# Patient Record
Sex: Female | Born: 2000 | Race: Black or African American | Hispanic: No | Marital: Single | Smoking: Never smoker
Health system: Southern US, Community
[De-identification: ages and names within clinical notes are randomized; demographics above are authoritative.]

## PROBLEM LIST (undated history)

## (undated) DIAGNOSIS — R569 Unspecified convulsions: Secondary | ICD-10-CM

## (undated) DIAGNOSIS — F71 Moderate intellectual disabilities: Secondary | ICD-10-CM

## (undated) DIAGNOSIS — R6250 Unspecified lack of expected normal physiological development in childhood: Secondary | ICD-10-CM

---
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
## 2021-02-02 ENCOUNTER — Other Ambulatory Visit (INDEPENDENT_AMBULATORY_CARE_PROVIDER_SITE_OTHER): Payer: Self-pay | Admitting: Pediatrics

## 2021-02-02 DIAGNOSIS — G40319 Generalized idiopathic epilepsy and epileptic syndromes, intractable, without status epilepticus: Secondary | ICD-10-CM

## 2021-02-02 MED ORDER — VIMPAT 100 MG PO TABS: ORAL_TABLET | ORAL | 1 refills | Status: DC

## 2021-02-02 NOTE — Telephone Encounter (Signed)
  Who's calling (name and relationship to patient) : Mom/ Adele   Best contact number:[phone]  Provider they see:Dr. Sharene Skeans   Reason for call:Medication Refill      PRESCRIPTION REFILL ONLY  Name of prescription:Vimpat   Pharmacy:[facility] [address] Ginette Otto, [place]

## 2021-02-19 ENCOUNTER — Encounter (INDEPENDENT_AMBULATORY_CARE_PROVIDER_SITE_OTHER): Payer: Self-pay | Admitting: Pediatrics

## 2021-02-19 ENCOUNTER — Ambulatory Visit (INDEPENDENT_AMBULATORY_CARE_PROVIDER_SITE_OTHER): Payer: Medicaid Other | Admitting: Pediatrics

## 2021-02-19 ENCOUNTER — Other Ambulatory Visit: Payer: Self-pay

## 2021-02-19 VITALS — BP 120/80 | HR 68 | Ht 60.5 in | Wt 99.8 lb

## 2021-02-19 DIAGNOSIS — G40319 Generalized idiopathic epilepsy and epileptic syndromes, intractable, without status epilepticus: Secondary | ICD-10-CM | POA: Diagnosis not present

## 2021-02-19 DIAGNOSIS — F70 Mild intellectual disabilities: Secondary | ICD-10-CM | POA: Diagnosis not present

## 2021-02-19 MED ORDER — PHENYTOIN SODIUM EXTENDED 100 MG PO CAPS: ORAL_CAPSULE | ORAL | 5 refills | Status: DC

## 2021-02-19 MED ORDER — LEVETIRACETAM 750 MG PO TABS: 1500.0000 mg | ORAL_TABLET | Freq: Two times a day (BID) | ORAL | 5 refills | Status: DC

## 2021-02-19 MED ORDER — DEPAKOTE 250 MG PO TBEC: DELAYED_RELEASE_TABLET | ORAL | 5 refills | Status: DC

## 2021-02-19 MED ORDER — VIMPAT 100 MG PO TABS: ORAL_TABLET | ORAL | 1 refills | Status: DC

## 2021-02-19 NOTE — Patient Instructions (Signed)
It was a pleasure to see you today.  I am glad that the seizures are very brief and that none have been prolonged.  [facility] [address].  2 of them I had to write so that they stay trade drug.  I will see Bethany Thompson in 4 months.  After that time she will be followed in our practice by Dr. Lezlie Lye.

## 2021-02-19 NOTE — Progress Notes (Signed)
Patient: Bethany Thompson MRN: [id number] Sex: female DOB: Dec 05, 2000  Provider: Ellison Carwin, MD Location of Care: [facility] Child Neurology  Note type: Routine return visit  History of Present Illness: Referral Source: Ivory Broad, MD History from: mother, patient and [facility] chart Chief Complaint: Seizures  Bethany Thompson is a 20 y.o. female who was evaluated February 19, 2021 for the first time since October 16, 2020.  She has intractable generalized tonic-clonic seizures.  Years ago she had prolonged episodes of status epilepticus that required hospitalization as per days to bring her seizures under control and recovered from post ictal symptoms.  Her prolonged seizures now are infrequent.  She has brief clonic activity that happens once a day and only last seconds.  There have been no emergency department visits.  From time to time there is difficulty getting her medications, but that is improving.  She is on polypharmacy.  She takes four antiepileptic medications.  Attempts to simplify her treatment regimen have been unsuccessful.  She takes and tolerates medication without side effects.  Her weight has been stable.  It has at times increased and significantly decreased.  Her mother is obtain guardianship.  She attends [employer].  She is in an EC class.  Her health is good.  She is sleeping well.  No other concerns were raised today.  Review of Systems: A complete review of systems was remarkable for patient is here to be seen for seizures. Mom reports that the patient has on seizure a day. She reports that the seizures only last a few seconds. She states that she has no other concerns at this time., all other systems reviewed and negative.  Past Medical History Diagnosis Date  . Development delay   . Moderate intellectual disabilities   . Seizures (HCC)    Hospitalizations: No., Head Injury: No., Nervous System Infections: No., Immunizations up to date: Yes.     Copied from prior chartnotes Bethany Thompson was hospitalized 08/14/14-08/16/14 at [facility] due to seizure activity. Prior to thatshe was hospitalized on January 18, 2014.[name]  She has been admitted to the hospital on multiple occasions. She has been the patient of mine since mid-May 2012, when she came to this area after immigrating from [place]. She had onset of seizures between three and four months of age after immunization. At the time, she was initially treated with phenobarbital and Tegretol. Her seizures were generalized tonic-clonic and would occur multiple times per day.  Initially, I hadsignificant problems with communication with her mother. She would sometimes allow her to have seizures for several hours intermittently before bringing her to the hospital. We finally convinced her to not allow her to have more than three seizures before presenting to the emergency room. Even with this, once clusters of seizures begin, they tend to persist until she is given very high doses of levetiracetam. She then sleeps for a period of a day or so and begins to return slowly to baseline.  MRI of the brain was normal. EEG shows frontally predominant spikes. She had an electrographic seizure that started with a 12 hertz polyspike activity and became electrodecremental at which time she had coincident generalized tonic-clonic seizure. She has diffuse background slowing.   Birth History She was a full-term infant with normal size following a normal birth.  Behavior History none  Surgical History History reviewed. No pertinent surgical history.  Family History Family history is unknown by patient. Family history is negative for migraines, seizures, intellectual disabilities, blindness, deafness,  birth defects, chromosomal disorder, or autism.  Social History Socioeconomic History  . Marital status: Single  . Years of education:  32  . Highest education level:  High school senior  Occupational  History  . Occupation: na  Tobacco Use  . Smoking status: Never Smoker  . Smokeless tobacco: Never Used  Vaping Use  . Vaping Use: Never used  Substance and Sexual Activity  . Alcohol use: No    Alcohol/week: 0.0 standard drinks  . Drug use: No  . 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    She attends [employer].    She has special assistance at school due to developmental delay.    She lives with mother, father, 3 brothers, 1 sister.     No tobacco exposure, no pets.     She enjoys playing video games and going to school   Allergies Allergen Reactions  . Benzodiazepines Other (See Comments)    See FYI. Per Dr. Sharene Skeans, recommended IV Keppra for seizure activity. Ativan/benzos makes patient somnolent but typically does not abort/decrease seizure activity.    Physical Exam BP 120/80   Pulse 68   Ht 5' 0.5" (1.537 m)   Wt 99 lb 12.8 oz (45.3 kg)   BMI 19.17 kg/m   General: alert, well developed, well nourished, in no acute distress, black hair, brown eyes, right handed Head: normocephalic, no dysmorphic features Ears, Nose and Throat: Otoscopic: tympanic membranes normal; pharynx: oropharynx is pink without exudates or tonsillar hypertrophy Neck: supple, full range of motion, no cranial or cervical bruits Respiratory: auscultation clear Cardiovascular: no murmurs, pulses are normal Musculoskeletal: no skeletal deformities or apparent scoliosis Skin: no rashes or neurocutaneous lesions  Neurologic Exam  Mental Status: alert; oriented to person; knowledge is below normal for age; language is below normal for [place], she seems to understand [place] better.  She is able to follow commands, count fingers and sometimes when she seemed confused about the command when her mother was speaking [place] she would follow it. Cranial Nerves: visual fields are full to double simultaneous  stimuli; extraocular movements are full and conjugate; pupils are round reactive to light; funduscopic examination shows sharp disc margins with normal vessels; symmetric facial strength; midline tongue and uvula; air conduction is greater than bone conduction bilaterally Motor: normal strength, tone and mass; good fine motor movements; no pronator drift Sensory: intact responses to cold, vibration Coordination: good finger-to-nose, rapid repetitive alternating movements and finger apposition Gait and Station: normal gait and station: patient is able to walk on heels, toes and tandem without difficulty; balance is adequate; Romberg exam is negative; Gower response is negative Reflexes: symmetric and diminished bilaterally; no clonus; bilateral flexor plantar responses  Assessment 1. 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 2. Mild intellectual disability, F70.  Discussion I am pleased that Bethany Thompson is not having prolonged seizures.  I do not think that adjusting her antiepileptic medications is going to bring about improvement with a brief twitching spell that occurs once a day.  She has lost a couple of pounds since her last visit, but this tends to fluctuate.  Plan Her mother asked that we switch her medications to the [facility] on the corner of [address] [place]  They have done a better job of making certain that she has Bethany Thompson's antiepileptic medications on a timely basis.  She will return to see me in 4 months.  After  that time, it is in her best interest to remain with our practice.  She is not able to adequately provide history for me.  Mother is able to do that with [name] interpreter.  Altogether, I think that is likely that her care will remain more consistent if she stays with the practice after my retirement August 10, 2021.   Medication List   Accurate as of February 19, 2021 11:41 AM. If you have any questions, ask your nurse or doctor.    acetaminophen  160 MG/5ML solution Commonly known as: TYLENOL Take 160 mg by mouth daily as needed for headache.   Depakote 250 MG DR tablet Generic drug: divalproex Take 3 tablets twice daily   levETIRAcetam 750 MG tablet Commonly known as: KEPPRA Take 2 tablets (1,500 mg total) by mouth 2 (two) times daily.   phenytoin 100 MG ER capsule Commonly known as: DILANTIN TAKE ONE CAPSULE BY MOUTH EVERY MORNING AND 2 CAPSULES EVERY NIGHT   Vimpat 100 MG Tabs Generic drug: Lacosamide TAKE 1 TABLET BY MOUTH EVERY MORNING AND 2 TABLETS BY MOUTH EVERY NIGHT AT BEDTIME.    The medication list was reviewed and reconciled. All changes or newly prescribed medications were explained.  A complete medication list was provided to the patient/caregiver.  Deetta Perla MD

## 2021-03-06 ENCOUNTER — Other Ambulatory Visit (INDEPENDENT_AMBULATORY_CARE_PROVIDER_SITE_OTHER): Payer: Self-pay | Admitting: Pediatrics

## 2021-03-06 DIAGNOSIS — G40319 Generalized idiopathic epilepsy and epileptic syndromes, intractable, without status epilepticus: Secondary | ICD-10-CM

## 2021-03-15 ENCOUNTER — Encounter (INDEPENDENT_AMBULATORY_CARE_PROVIDER_SITE_OTHER): Payer: Self-pay

## 2021-04-25 ENCOUNTER — Other Ambulatory Visit: Payer: Self-pay | Admitting: Pediatrics

## 2021-05-09 ENCOUNTER — Other Ambulatory Visit (INDEPENDENT_AMBULATORY_CARE_PROVIDER_SITE_OTHER): Payer: Self-pay | Admitting: Pediatrics

## 2021-05-09 DIAGNOSIS — G40319 Generalized idiopathic epilepsy and epileptic syndromes, intractable, without status epilepticus: Secondary | ICD-10-CM

## 2021-05-28 ENCOUNTER — Other Ambulatory Visit (INDEPENDENT_AMBULATORY_CARE_PROVIDER_SITE_OTHER): Payer: Self-pay | Admitting: Pediatrics

## 2021-05-28 DIAGNOSIS — G40319 Generalized idiopathic epilepsy and epileptic syndromes, intractable, without status epilepticus: Secondary | ICD-10-CM

## 2021-07-02 ENCOUNTER — Other Ambulatory Visit: Payer: Self-pay

## 2021-07-02 ENCOUNTER — Ambulatory Visit (INDEPENDENT_AMBULATORY_CARE_PROVIDER_SITE_OTHER): Payer: Medicaid Other | Admitting: Pediatrics

## 2021-07-02 ENCOUNTER — Encounter (INDEPENDENT_AMBULATORY_CARE_PROVIDER_SITE_OTHER): Payer: Self-pay | Admitting: Pediatrics

## 2021-07-02 VITALS — BP 112/90 | HR 72 | Ht 60.5 in | Wt 93.6 lb

## 2021-07-02 DIAGNOSIS — G40319 Generalized idiopathic epilepsy and epileptic syndromes, intractable, without status epilepticus: Secondary | ICD-10-CM

## 2021-07-02 DIAGNOSIS — G40219 Localization-related (focal) (partial) symptomatic epilepsy and epileptic syndromes with complex partial seizures, intractable, without status epilepticus: Secondary | ICD-10-CM

## 2021-07-02 DIAGNOSIS — F70 Mild intellectual disabilities: Secondary | ICD-10-CM

## 2021-07-02 MED ORDER — LEVETIRACETAM 750 MG PO TABS: ORAL_TABLET | ORAL | 5 refills | Status: DC

## 2021-07-02 MED ORDER — DEPAKOTE 250 MG PO TBEC: DELAYED_RELEASE_TABLET | ORAL | 5 refills | Status: DC

## 2021-07-02 MED ORDER — PHENYTOIN SODIUM EXTENDED 100 MG PO CAPS: ORAL_CAPSULE | ORAL | 5 refills | Status: DC

## 2021-07-02 MED ORDER — VIMPAT 100 MG PO TABS: ORAL_TABLET | ORAL | 5 refills | Status: DC

## 2021-07-02 NOTE — Patient Instructions (Addendum)
Thank you for coming today.  You will return in 4 montts' time to see Dr. Lezlie Lye.  I have written out all 4 medications so that they are prescribed as I intended.  Take them to the pharmacy for refill.  The primary care physician will need to either take care of of the boil or refer her to another doctor.  You should talk with [employer] to see if they will allow Shaunta to continue to attend until she is 23.  Many of my other young adults have done that

## 2021-07-02 NOTE — Progress Notes (Signed)
Patient: Bethany Thompson MRN: [id number] Sex: female DOB: Apr 28, 2001  Provider: Ellison Carwin, MD Location of Care: [facility] Child Neurology  Note type: Routine return visit  History of Present Illness: Referral Source: Ivory Broad, MD History from: mother and interpreter, patient, and [facility] chart Chief Complaint: Seizures  Bethany Thompson is a 20 y.o. female who was evaluated July 02, 2021 for the first time since February 19, 2021.  She has intractable generalized tonic-clonic seizures.  Years ago she had prolonged episodes of status epilepticus that required hospitalization for days in order to be bring her seizures under control and recover from postictal stupor.  Prolonged seizures are rare.  She has episodes of brief clonic activity that occur nearly daily and last for seconds.  Her mother says that she has had brief convulsive seizures that have happened daily for the past week.  Her Depakote which was trade drug is now divalproex.  I do not know how that happened.  I also do not know if it has anything to do with her increased seizure frequency.  2 of her drugs or trade drugs, Depakote and Vimpat the other 2 are generic, phenytoin and levetiracetam.  She needs all 4 in order to keep her seizures under their current control.  Attempts to simplify her treatment regimen of been unsuccessful.  She tolerates all 4 medications without significant side effects.  Her general health is good.  Her weight is down about 5 pounds from her last visit.  This has fluctuated throughout the years.  Her mother obtained guardianship.  For reasons that I do not understand she graduated from [employer] when she could have continued at school.  Mother now has a problem with daycare because she has to work for 10-hour days a week.  And there is no one at home to watch [name].  I would be very reluctant for her to be left home alone.  Currently her cousins are at home with her but they go to  school.  She had a strep throat about 2 weeks ago.  Currently she has a boil on her breast which needs attention before it becomes a significant cellulitis.  She is complained of having some pain when she swallows her pills.  She says that this is been present for a long time.  Looking at her mouth today, I see no inflammation.  I can think of no reason why the pills would cause her to have a sore throat because she is not taking liquid but is taking a mixture of tablets and capsules.  It may be that if she takes them 1 at a time rather than all at once that it will be easier.  I do not know.  Review of Systems: A complete review of systems was remarkable for patient is here to be seen for seizures. Mom reports that the patient experienced one seizure everyday last week.She reports that the seizures would last only a few minutes. She reports no concerns at this time., all other systems reviewed and negative.  Past Medical History Diagnosis Date   Development delay    Moderate intellectual disabilities    Seizures (HCC)    Hospitalizations: No., Head Injury: No., Nervous System Infections: No., Immunizations up to date: Yes.    Copied from prior chart notes Tully was hospitalized 08/14/14-08/16/14 at [facility] due to seizure activity. Prior to that she was hospitalized on January 18, 2014.[name]    She has been admitted to the hospital  on multiple occasions. She has been the patient of mine since mid-May 2012, when she came to this area after immigrating from [place]. She had onset of seizures between three and four months of age after immunization. At the time, she was initially treated with phenobarbital and Tegretol. Her seizures were generalized tonic-clonic and would occur multiple times per day.    Initially, I had significant problems with communication with her mother. She would sometimes allow her to have seizures for several hours intermittently before bringing her to the hospital. We finally  convinced her to not allow her to have more than three seizures before presenting to the emergency room. Even with this, once clusters of seizures begin, they tend to persist until she is given very high doses of levetiracetam. She then sleeps for a period of a day or so and begins to return slowly to baseline.    MRI of the brain was normal. EEG shows frontally predominant spikes. She had an electrographic seizure that started with a 12 hertz polyspike activity and became electrodecremental at which time she had coincident generalized tonic-clonic seizure. She has diffuse background slowing.    Birth History She was a full-term infant with normal size following a normal birth.  Behavior History none  Surgical History History reviewed. No pertinent surgical history.  Family History Family history is unknown by patient. Family history is negative for migraines, seizures, intellectual disabilities, blindness, deafness, birth defects, chromosomal disorder, or autism.  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   She attended [employer].   She had special assistance at school due to developmental delay.   She lives with mother, father, 3 brothers, 1 sister.    No tobacco exposure, no pets.    She enjoys playing video games and going to school   Allergies Allergen Reactions   Benzodiazepines Other (See Comments)    See FYI. Per Dr. Sharene Skeans, recommended IV Keppra for seizure activity. Ativan/benzos makes  patient somnolent but typically does not abort/decrease seizure activity.    Physical Exam BP 112/90   Pulse 72   Ht 5' 0.5" (1.537 m)   Wt 93 lb 9.6 oz (42.5 kg)   BMI 17.98 kg/m   General: alert, well developed, well nourished, in no acute distress, black hair, brown eyes, right handed Head: normocephalic, no dysmorphic features Ears, Nose and Throat: Otoscopic: tympanic membranes normal; pharynx: oropharynx is pink without exudates or tonsillar hypertrophy Neck: supple, full range of motion, no cranial or cervical bruits Respiratory: auscultation clear Cardiovascular: no murmurs, pulses are normal Musculoskeletal: no skeletal deformities or apparent scoliosis Skin: no rashes or neurocutaneous lesions  Neurologic Exam  Mental Status: alert; oriented to person, place and year; knowledge is below normal for age; language is below normal; she seems to understand commands better in [place] than in [place] Cranial Nerves: visual fields are full to double simultaneous stimuli; extraocular movements are full and conjugate; pupils are round reactive to light; funduscopic examination  shows sharp disc margins with normal vessels; symmetric facial strength; midline tongue and uvula; air conduction is greater than bone conduction bilaterally Motor: normal strength, tone and mass; good fine motor movements; no pronator drift Sensory: intact responses to cold, vibration, proprioception and stereognosis Coordination: good finger-to-nose, rapid repetitive alternating movements and finger apposition Gait and Station: normal gait and station: patient is able to walk on heels, toes and tandem without difficulty; balance is adequate; Romberg exam is negative; Gower response is negative Reflexes: symmetric and diminished bilaterally; no clonus; bilateral flexor plantar responses   Assessment 1. 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 2. Mild intellectual disability, F70. 3.  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  Discussion Claude continues to be stable.  She has breakthrough seizures although they are not nearly as severe as they used to be.  I am very concerned about her being left alone during the day while her marrow mother works.    Plan We will investigate to see if there is some program that we can get her into but given that she does not qualify for CAPS, I am not certain that there is anything that can be done.  She may qualify for disability.  I strongly urged her mother to contact Social Security and request an evaluation for disability so that she could get SSI money.  I refilled prescriptions for trade drug Depakote and Vimpat as well as levetiracetam and phenytoin.  All were printed and given to mother.  Greater than 50% of a 30-minute visit was spent in counseling coordination of care concerning her seizures, and her home situation.  We also discussed transition of care.  She will follow-up in in 4 months with Dr. Lezlie Lye, my partner who has trained in epilepsy fellowship.  I will be available for the family until August 10, 2021 when I retire. Allergies as of 07/02/2021       Reactions   Benzodiazepines Other (See Comments)   See FYI. Per Dr. Sharene Skeans, recommended IV Keppra for seizure activity. Ativan/benzos makes patient somnolent but typically does not abort/decrease seizure activity.         Medication List        Accurate as of July 02, 2021 10:29 AM. If you have any questions, ask your nurse or doctor.          acetaminophen 160 MG/5ML solution Commonly known as: TYLENOL Take 160 mg by mouth daily as needed for headache.   Depakote 250 MG DR tablet Generic drug: divalproex Take 3 tablets twice daily What changed: Another medication with the same name was removed. Continue taking this medication, and follow the directions you see here. Changed by: Ellison Carwin, MD   Lacosamide 100 MG  Tabs TAKE 1 TABLET BY MOUTH EVERY MORNING AND 2 TABLETS EVERY NIGHT AT BEDTIME   levETIRAcetam 750 MG tablet Commonly known as: KEPPRA TAKE 2 TABLETS(1500 MG) BY MOUTH TWICE DAILY   phenytoin 100 MG ER capsule Commonly known as: DILANTIN TAKE ONE CAPSULE BY MOUTH EVERY MORNING AND 2 CAPSULES EVERY NIGHT        The medication list was reviewed and reconciled. All changes or newly prescribed medications were explained.  A complete medication list was provided to the patient/caregiver.  Deetta Perla MD

## 2021-08-02 ENCOUNTER — Other Ambulatory Visit (INDEPENDENT_AMBULATORY_CARE_PROVIDER_SITE_OTHER): Payer: Self-pay | Admitting: Family

## 2021-08-02 DIAGNOSIS — G40319 Generalized idiopathic epilepsy and epileptic syndromes, intractable, without status epilepticus: Secondary | ICD-10-CM

## 2021-08-06 ENCOUNTER — Telehealth (INDEPENDENT_AMBULATORY_CARE_PROVIDER_SITE_OTHER): Payer: Self-pay | Admitting: Pediatrics

## 2021-08-06 NOTE — Telephone Encounter (Signed)
Spoke to pharmacy, they state that they need a prior authorization for the medication. Prior Berkley Harvey has been done.

## 2021-08-06 NOTE — Telephone Encounter (Signed)
  Who's calling (name and relationship to patient) :Duanne Guess (Mother)  Best contact number: [phone] (Home) Provider they see: Deetta Perla, MD Reason for call:  Please contact pharmacy because they are stating that even though mom brought paper copies of the rx the doctor need to send in rx to the pharmacy  Patient needs all medication she has none.  Please contact mom when completed   PRESCRIPTION REFILL ONLY  Name of prescription:  Pharmacy:  [facility] [phone]

## 2021-08-27 ENCOUNTER — Other Ambulatory Visit: Payer: Self-pay

## 2021-08-27 ENCOUNTER — Emergency Department (HOSPITAL_COMMUNITY): Payer: Medicaid Other

## 2021-08-27 ENCOUNTER — Emergency Department (HOSPITAL_COMMUNITY)
Admission: EM | Admit: 2021-08-27 | Discharge: 2021-08-27 | Disposition: A | Discharge status: Home / Self Care | Payer: Medicaid Other | Attending: Emergency Medicine | Admitting: Emergency Medicine

## 2021-08-27 DIAGNOSIS — W01198A Fall on same level from slipping, tripping and stumbling with subsequent striking against other object, initial encounter: Secondary | ICD-10-CM | POA: Diagnosis not present

## 2021-08-27 DIAGNOSIS — S0181XA Laceration without foreign body of other part of head, initial encounter: Secondary | ICD-10-CM | POA: Diagnosis not present

## 2021-08-27 DIAGNOSIS — S0990XA Unspecified injury of head, initial encounter: Secondary | ICD-10-CM | POA: Diagnosis present

## 2021-08-27 DIAGNOSIS — G40909 Epilepsy, unspecified, not intractable, without status epilepticus: Secondary | ICD-10-CM | POA: Insufficient documentation

## 2021-08-27 DIAGNOSIS — R569 Unspecified convulsions: Secondary | ICD-10-CM

## 2021-08-27 DIAGNOSIS — Z79899 Other long term (current) drug therapy: Secondary | ICD-10-CM | POA: Diagnosis not present

## 2021-08-27 LAB — I-STAT BETA HCG BLOOD, ED (MC, WL, AP ONLY): I-stat hCG, quantitative: <5 m[IU]/mL (ref <5)

## 2021-08-27 LAB — CBG MONITORING, ED: Glucose-Capillary: 83 mg/dL (ref 70–99)

## 2021-08-27 NOTE — Discharge Instructions (Addendum)
Recommend bacitracin/Neosporin ointment to skin tears twice a day and cover with Band-Aid.  Images today did not show any acute injuries.  Continue taking your medications as prescribed.  Follow-up with neurologist if seizures continue to happen more frequesntly

## 2021-08-27 NOTE — ED Triage Notes (Signed)
BIBA Per EMS: Pt coming from school bus,pt had 5 minute seizure on bus. Fell and hit head. 2 lacs of R side of head. Alert to self. Postictal. 20G L AC  Pt has guardian who is with her. Hx seizure; 1-2 per day

## 2021-08-27 NOTE — ED Provider Notes (Signed)
[facility]-EMERGENCY DEPT Provider Note   CSN: [id number] Arrival date & time: 08/27/21  9798     History Chief Complaint  Patient presents with   Seizures    Bethany Thompson is a 20 y.o. female.  Patient with breakthrough seizure today.  Taking medicines as prescribed.  Took her morning medications.  Sounds like she had a seizure while on the school bus.  She has a skin tear to her right forearm and right face.  She was initially confused but now at her baseline per patient and her family member.  The history is provided by the patient and a caregiver.  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   Recent unexplained weight loss    Post-ictal confusion    Heart murmur 11/01/2015   Loss of weight 08/11/2015   Menorrhagia 03/01/2015   Problems with learning 09/02/2014   Subtherapeutic serum dilantin level 08/15/2014   Subtherapeutic serum phenytoin level 08/15/2014   Seizures (HCC) 08/14/2014   Seizure disorder (HCC) 01/18/2014   Status epilepticus (HCC) 01/18/2014   Fever 08/15/2013   Chronic disease 04/27/2013   BMI (body mass index), pediatric, 85% to less than 95% for age 61/17/2014   Essential and other specified forms of tremor 04/15/2013   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   OB History   No obstetric history on file.     Family History  Family history unknown: Yes    Social History   Tobacco Use   Smoking status: Never   Smokeless tobacco: Never  Vaping Use   Vaping Use: Never used  Substance Use Topics   Alcohol use: No    Alcohol/week: 0.0 standard drinks   Drug use: No    Home Medications Prior to Admission medications   Medication Sig Start Date End Date Taking? Authorizing Provider  acetaminophen (TYLENOL) 160 MG/5ML solution Take 160 mg by mouth daily as needed for headache.     [provider]  DEPAKOTE 250 MG DR tablet Take 3 tablets twice daily 07/02/21   Deetta Perla, MD  levETIRAcetam (KEPPRA) 750 MG tablet TAKE 2 TABLETS(1500 MG) BY MOUTH TWICE DAILY 07/02/21   Deetta Perla, MD  phenytoin (DILANTIN) 100 MG ER capsule TAKE ONE CAPSULE BY MOUTH EVERY MORNING AND 2 CAPSULES EVERY NIGHT 07/02/21   Deetta Perla, MD  VIMPAT 100 MG TABS Take 1 tablet in the morning and 3 tablets at nighttime 07/02/21   Deetta Perla, MD  phenytoin (DILANTIN) 50 MG tablet Chew 2 tablets (100 mg total) by mouth 2 (two) times daily. 04/09/13 05/06/13  Stiff, Victorino Dike,  MD    Allergies    Benzodiazepines  Review of Systems   Review of Systems  Constitutional:  Negative for chills and fever.  HENT:  Negative for ear pain and sore throat.   Eyes:  Negative for pain and visual disturbance.  Respiratory:  Negative for cough and shortness of breath.   Cardiovascular:  Negative for chest pain and palpitations.  Gastrointestinal:  Negative for abdominal pain and vomiting.  Genitourinary:  Negative for dysuria and hematuria.  Musculoskeletal:  Negative for arthralgias and back pain.  Skin:  Positive for wound. Negative for color change and rash.   Neurological:  Positive for seizures. Negative for syncope.  All other systems reviewed and are negative.  Physical Exam Updated Vital Signs BP 124/71   Pulse 92   Temp (!) 97.4 F (36.3 C) (Oral)   Resp 18   SpO2 100%   Physical Exam Vitals and nursing note reviewed.  Constitutional:      General: She is not in acute distress.    Appearance: She is well-developed. She is not ill-appearing.  HENT:     Head: Normocephalic and atraumatic.     Nose: Nose normal.     Mouth/Throat:     Mouth: Mucous membranes are moist.  Eyes:     Extraocular Movements: Extraocular movements intact.     Conjunctiva/sclera: Conjunctivae normal.     Pupils: Pupils are equal, round, and reactive to light.  Cardiovascular:     Rate and Rhythm: Normal rate and regular rhythm.     Heart sounds: No murmur heard. Pulmonary:     Effort: Pulmonary effort is normal. No respiratory distress.     Breath sounds: Normal breath sounds.  Abdominal:     General: Abdomen is flat.     Palpations: Abdomen is soft.     Tenderness: There is no abdominal tenderness.  Musculoskeletal:     Cervical back: Normal range of motion and neck supple. No tenderness.  Skin:    General: Skin is warm and dry.     Comments: Skin tear to forehead and right side of face   Neurological:     General: No focal deficit present.     Mental Status: She is alert and oriented to person, place, and time.     Cranial Nerves: No cranial nerve deficit.     Sensory: No sensory deficit.     Motor: No weakness.     Coordination: Coordination normal.    ED Results / Procedures / Treatments   Labs (all labs ordered are listed, but only abnormal results are displayed) Labs Reviewed  I-STAT BETA HCG BLOOD, ED ([facility], [facility], [facility] ONLY)  CBG MONITORING, ED    EKG EKG Interpretation  Date/Time:  Monday August 27 2021 10:09:11 EDT Ventricular Rate:  99 PR Interval:  156 QRS Duration: 85 QT Interval:  311 QTC Calculation: 399 R  Axis:   77 Text Interpretation: Sinus rhythm Confirmed by Virgina Norfolk ([id number]) on 08/27/2021 10:22:29 AM  Radiology CT Head Wo Contrast  Result Date: 08/27/2021 CLINICAL DATA:  Seizure, fall EXAM: CT HEAD WITHOUT CONTRAST TECHNIQUE: Contiguous axial images were obtained from the base of the skull through the vertex without intravenous contrast. COMPARISON:  MRI 03/27/2011 FINDINGS: Brain: No evidence of acute infarction, hemorrhage, hydrocephalus, extra-axial collection or mass lesion/mass effect. Vascular: No hyperdense vessel or unexpected calcification. Skull: Normal. Negative for fracture or focal lesion. Sinuses/Orbits: No acute finding. Other: Negative for scalp hematoma. IMPRESSION: No acute intracranial findings. Electronically  Signed   By: Duanne Guess D.O.   On: 08/27/2021 10:56   CT Cervical Spine Wo Contrast  Result Date: 08/27/2021 CLINICAL DATA:  Polytrauma, critical, head/C-spine injury suspected fall, truama EXAM: CT CERVICAL SPINE WITHOUT CONTRAST TECHNIQUE: Multidetector CT imaging of the cervical spine was performed without intravenous contrast. Multiplanar CT image reconstructions were also generated. COMPARISON:  X-ray 07/01/2017 FINDINGS: Alignment: Facet joints are aligned without dislocation or traumatic listhesis. Dens and lateral masses are aligned. Skull base and vertebrae: No acute fracture. No primary bone lesion or focal pathologic process. 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 Soft tissues and spinal canal: No prevertebral fluid or swelling. No visible canal hematoma. Disc levels:  Normal. Upper chest: Included lung apices are clear. Other: None. IMPRESSION: No acute fracture or traumatic listhesis of the cervical spine. Electronically Signed   By: Duanne Guess D.O.   On: 08/27/2021 11:18    Procedures Procedures   Medications Ordered in ED Medications - No data to display  ED Course  I have reviewed the triage vital signs  and the nursing notes.  Pertinent labs & imaging results that were available during my care of the patient were reviewed by me and considered in my medical decision making (see chart for details).    MDM Rules/Calculators/A&P                           Monna Crean is here with seizures.  Overall unremarkable vitals.  Patient with breakthrough seizure today while off school bus.  History of the same.  On 4 antiepileptics.  Follows with neurology.  Does have seizure breakthroughs every once in a while.  Denies any fevers or chills.  Took all of her medications this morning.  She is back to her baseline after having a period of confusion afterwards.  Has skin tear to her right forehead and right she that we will clean off and put antibiotic ointment on it.  Not amenable to laceration repair with sutures.  We will get head and neck CT given trauma during the fall.  Overall she appears well.  Neurologically she is intact.  No extremity tenderness.  Images unremarkable.  EKG shows sinus rhythm.  CBG within normal limits.  Patient back at her baseline.  Discharged in good condition.  This chart was dictated using voice recognition software.  Despite best efforts to proofread,  errors can occur which can change the documentation meaning.   Final Clinical Impression(s) / ED Diagnoses Final diagnoses:  Seizure-like activity [facility])    Rx / DC Orders ED Discharge Orders     None        Virgina Norfolk, DO 08/27/21 1124

## 2021-09-02 ENCOUNTER — Other Ambulatory Visit (INDEPENDENT_AMBULATORY_CARE_PROVIDER_SITE_OTHER): Payer: Self-pay | Admitting: Pediatrics

## 2021-09-17 ENCOUNTER — Telehealth (INDEPENDENT_AMBULATORY_CARE_PROVIDER_SITE_OTHER): Payer: Self-pay | Admitting: Pediatrics

## 2021-09-17 NOTE — Telephone Encounter (Signed)
Forms have been placed on Bethany Thompson's desk for evaluation.

## 2021-09-17 NOTE — Telephone Encounter (Signed)
  Who's calling (name and relationship to patient) :Madzinou,Adele   Best contact number: [phone] Provider they see: Abdelmoumen Reason for call: FMLA documents were dropped off and placed in providers box for completion please contact mom when completed so that she can pick them up     PRESCRIPTION REFILL ONLY  Name of prescription:  Pharmacy:

## 2021-10-08 NOTE — Telephone Encounter (Signed)
Please let Mom know that the FMLA form is ready to be picked up. Thanks, Inetta Fermo

## 2021-10-08 NOTE — Telephone Encounter (Signed)
Forms have been placed up front for pick up.

## 2021-10-08 NOTE — Telephone Encounter (Signed)
Mom contacted office, she is on the way to pick forms up

## 2021-10-08 NOTE — Telephone Encounter (Signed)
Attempted to call mom, no answer. Not able to leave vm.

## 2021-10-15 ENCOUNTER — Ambulatory Visit (INDEPENDENT_AMBULATORY_CARE_PROVIDER_SITE_OTHER): Payer: Medicaid Other | Admitting: Pediatrics

## 2021-10-15 ENCOUNTER — Other Ambulatory Visit: Payer: Self-pay

## 2021-10-15 ENCOUNTER — Encounter (INDEPENDENT_AMBULATORY_CARE_PROVIDER_SITE_OTHER): Payer: Self-pay | Admitting: Pediatrics

## 2021-10-15 VITALS — BP 110/70 | HR 88 | Ht 60.12 in | Wt 92.6 lb

## 2021-10-15 DIAGNOSIS — G40319 Generalized idiopathic epilepsy and epileptic syndromes, intractable, without status epilepticus: Secondary | ICD-10-CM

## 2021-10-15 DIAGNOSIS — F70 Mild intellectual disabilities: Secondary | ICD-10-CM | POA: Diagnosis not present

## 2021-10-15 DIAGNOSIS — G40219 Localization-related (focal) (partial) symptomatic epilepsy and epileptic syndromes with complex partial seizures, intractable, without status epilepticus: Secondary | ICD-10-CM

## 2021-10-15 NOTE — Progress Notes (Signed)
Patient: Bethany Thompson MRN: [id number] Sex: female DOB: [birth year]  Provider: Lezlie Lye, MD Location of Care: Pediatric Specialist- Pediatric Neurology Note type: return visit for follow up Referral Source: [name], MD Date of Evaluation: 10/15/2021 Chief Complaint: refractory epilepsy Visit type: In-person Last visit: 07/02/2021  Bethany Thompson is a 20 y.o. female with history significant for refractory epilepsy and intellectual disability.  Interim History: Bethany Thompson had 3 breakthrough seizures since last time seen in child neurology office. Mother denied missing her antiseizure medication, and can not identified triggers for seizures. Mother reported that her seizures frequency has gotten better for the past years. Bethany Thompson had history of status epilepticus with frequent hospitalization.   08/27/2021: she was on the bus on way to school. She had her typical seizure resulted in skin tear in right forearm and right face. She was confused after seizure. EMS was called and transferred to [facility] hospital. She was confused in ED but back to baseline after few hours. Work up including Head CT without contrast and CT cervical spine without contrast resulted within normal (no acute intracranial finding and no acute fracture or traumatic listhesis of the cervical spine).   In November 2022: Bethany Thompson had one seizure. Her mother witnessed her seizure and described seizure as she was unresponsive for few seconds associated with jerking movements in all of her body lasted about few seconds. Postictal fatigue that last for 24 hours.   In December 5th, 2022: had 1 seizure. Patient woke up and took a shower before going to school. Mother found her sitting in the bathtub and looked very tired and unresponsive to mother questions associated with jerking movements in her arms. The episode lasted approximately few seconds.   Today's concerns:  Bethany Thompson has been otherwise generally healthy since  he was last seen. Neither patient nor mother have other health concerns for  today other than previously mentioned.  Brief history: Copied from previous record: Bethany Thompson was hospitalized 08/14/14-08/16/14 at [facility] due to seizure activity. Prior to that she was hospitalized on January 18, 2014.    She has been admitted to the hospital on multiple occasions. She has been the patient of mine since mid-May 2012, when she came to this area after immigrating from [place]. She had onset of seizures between [age] and four months of age after immunization. At the time, she was initially treated with phenobarbital and Tegretol. Her seizures were generalized tonic-clonic and would occur multiple times per day.    History of significant problems with communication with her mother. She would sometimes allow her to have seizures for several hours intermittently before bringing her to the hospital. We finally convinced her to not allow her to have more than three seizures before presenting to the emergency room. Even with this, once clusters of seizures begin, they tend to persist until she is given very high doses of levetiracetam. She then sleeps for a period of a day or so and begins to return slowly to baseline.    MRI of the brain was normal. EEG shows frontally predominant spikes. She had an electrographic seizure that started with a 12 hertz polyspike activity and became electrodecremental at which time she had coincident generalized tonic-clonic seizure. She has diffuse background slowing.   Epilepsy/seizure History: (summarize)  Age at seizure onset: 60 months old Description of all seizure types and duration: Generalized tonic clonic with myoclonic seizures lasted approximately <1 minute in duration.    Complications from seizures (trauma, etc.): None h/o status epilepticus:  multiple admissions due to status Epilepticus.   Date of most recent seizure: 10/15/2021 Seizure frequency past month (exact  number or average per day): 1 Past 3 months: 3 Past year:3-4   Current AEDs: Depakote 750 mg BID Vimpat 100 mg in am and 200 mg at night Phenytoin 100 mg in am and 200 mg at night Keppra 1500 mg BID  Current side effects: None Prior AEDs (d/c reason?): None Other Meds (including OCP):  Adherence Estimate: Excellent  Epilepsy risk factors:   Maternal pregnancy/delivery and postnatal course normal.  Normal development.  No h/o staring spells or febrile seizures.  No meningitis/encephalitis, no h/o LOC or head trauma.  She wants playstation for chrismas.   Past Medical History: Epilepsy Intellectual disability  Past Surgical History: None  Allergies  Allergen Reactions   Benzodiazepines Other (See Comments)    See FYI. Per Dr. Sharene Skeans, recommended IV Keppra for seizure activity. Ativan/benzos makes patient somnolent but typically does not abort/decrease seizure activity.     Medications: Depakote 750 mg BID Vimpat 100 mg in am and 200 mg at night Phenytoin 100 mg in am and 200 mg at night Keppra 1500 mg BID  Birth History Mother reported that she was born full-term via normal vaginal delivery with no perinatal events. Unknown birth weight, length or head circumference.   Developmental history: Intellectual disability.  Schooling: she attends [employer].  She is a Engineer, agricultural. There are no apparent school problems with peers.  Social and family history: she lives with both parents and siblings. she has 3 brothers and 1 sister.  She enjoys playing video games and going to school.  Both parents are in apparent good health. Siblings are also healthy. There is no family history of speech delay, learning difficulties in school, intellectual disability, epilepsy or neuromuscular disorders.   Family History Family history is unknown by patient.  Review of Systems Constitutional: Negative for fever, malaise/fatigue and weight loss.  HENT: Negative for  congestion, ear pain, hearing loss, sinus pain and sore throat.   Eyes: Negative for blurred vision, double vision, photophobia, discharge and redness.  Respiratory: Negative for cough, shortness of breath and wheezing.   Cardiovascular: Negative for chest pain, palpitations and leg swelling.  Gastrointestinal: Negative for abdominal pain, blood in stool, constipation, nausea and vomiting.  Genitourinary: Negative for dysuria and frequency.  Musculoskeletal: Negative for back pain, falls, joint pain and neck pain.  Skin: Negative for rash.  Neurological: Negative for dizziness, tremors, focal weakness, weakness and headaches. Positive for seizures Psychiatric/Behavioral: intellectual disability. The patient is not nervous/anxious and does not have insomnia.   EXAMINATION Physical examination: Today's Vitals   10/15/21 1451  BP: 110/70  Pulse: 88  Weight: 92 lb 9.5 oz (42 kg)  Height: 5' 0.12" (1.527 m)   Body mass index is 18.01 kg/m.   General examination: she is alert and active in no apparent distress. There are no dysmorphic features. Chest examination reveals normal breath sounds, and normal heart sounds with no cardiac murmur.  Abdominal examination does not show any evidence of hepatic or splenic enlargement, or any abdominal masses or bruits.  Skin evaluation does not reveal any caf-au-lait spots, hypo or hyperpigmented lesions, hemangiomas or pigmented nevi. Neurologic examination: she is awake, alert, cooperative and responsive to some questions when she understands.  she follows all commands readily.  Speech is slow, limited speech output with no echolalia.  Cranial nerves: Pupils are equal, symmetric, circular and reactive to light. Extraocular  movements are full in range, with no strabismus.  There is no ptosis or nystagmus.  Facial sensations are intact.  There is no facial asymmetry, with normal facial movements bilaterally.  Hearing is normal to finger-rub testing. Palatal  movements are symmetric.  The tongue is midline. Motor assessment: The tone is normal.  Movements are symmetric in all four extremities, with no evidence of any focal weakness.  Power is 5/5 in all groups of muscles across all major joints.  There is no evidence of atrophy or hypertrophy of muscles.  Deep tendon reflexes are 2+ and symmetric at the biceps, triceps, knees and ankles.  Plantar response is flexor bilaterally. Sensory examination:  light touch does not reveal any sensory deficits. Co-ordination and gait:  Finger-to-nose testing is normal bilaterally.  Fine finger movements and rapid alternating movements are within normal range.  Mirror movements are not present.  There is no evidence of tremor, dystonic posturing or any abnormal movements.   Romberg's sign is absent.  Gait is normal with equal arm swing bilaterally and symmetric leg movements.  Heel, toe and tandem walking are within normal range.    Assessment and Plan Eleesha Purkey is a 20 y.o. female with significant history of refractory epilepsy and intellectual disability who is here for follow-up.  Patient had breakthrough seizures for the last 3-4 months, described as typical seizures with generalized stiffening/clonic/jerking movements associated with unresponsiveness.  Her seizure last few seconds with prolonged postictal fatigue.  Her mother denied any missing doses of her antiseizure medications.  Her physical neurological examination is stable from the last visit.  I have discussed with mother to increase her seizure medication because of having breakthrough seizures.  Mother refused to touch her antiseizure medication and thinks that she is doing better comparing with her uncontrolled seizures many years ago.  Mother is open to check antiseizure levels and do some blood work.  I told mother to follow-up with Dr. Merri Brunette in 3 months giving her breakthrough seizures 1-2/month.  I have noticed discrepancy in prescription for Vimpat.   Dr. Sharene Skeans prescribed 100 mg in the morning and 300 mg at night but mother reported that she gives 100 mg in the morning and 200 mg at night.  We will check the level and adjust her dose based on antiseizure medication levels.  PLAN: Continue Depakote 750 mg BID Continue Vimpat 100 mg in am and 200 mg at night Continue Phenytoin 100 mg in am and 200 mg at night Continue Keppra 1500 mg BID Please start vitamin D and calcium supplements We have discussed birth control in epilepsy patient taking antiseizure medication.  Follow up in 3 months with Dr Nab Blood lab work CBC, CMP, Vitamin D level, VPA, Phenytoin, Lacosomide, keppra level FMLE paper work was faxed to her Job.    Counseling/Education: seizure safety. Vitamin D and calcium supplement for bone health because she taking antiseizure medications. I have also discussed starting birth control and risk of birth malformation due to AEDs. Mother verbalize understanding.   Total time spent with the patient was 40 minutes, of which 50% or more was spent in counseling and coordination of care.   The plan of care was discussed, with acknowledgement of understanding expressed by her mother.    Lezlie Lye Neurology and epilepsy attending [facility] Child [facility] Ph. [phone] Fax [phone]

## 2021-10-15 NOTE — Patient Instructions (Addendum)
I had the pleasure of seeing Bethany Thompson today for epilepsy follow up. Bethany Thompson was accompanied by her mother who provided historical information.    Plan: Continue Depakote 750 mg BID Continue Vimpat 100 mg in am and 200 mg at night Continue Phenytoin 100 mg in am and 200 mg at night Continue Keppra 1500 mg BID Please start vitamin D and calcium supplements We have discussed birth control in epilepsy patient taking antiseizure medication.  Follow up in 3 months with Dr Nab Blood lab work CBC, CMP, Vitamin D level, VPA, Phenytoin, Lacosomide, keppra level

## 2021-10-23 MED ORDER — VIMPAT 100 MG PO TABS: ORAL_TABLET | ORAL | 4 refills | Status: DC

## 2021-10-23 MED ORDER — LEVETIRACETAM 750 MG PO TABS: 1500.0000 mg | ORAL_TABLET | Freq: Two times a day (BID) | ORAL | 4 refills | Status: DC

## 2021-10-23 MED ORDER — PHENYTOIN SODIUM EXTENDED 100 MG PO CAPS: ORAL_CAPSULE | ORAL | 4 refills | Status: DC

## 2021-10-23 MED ORDER — DEPAKOTE 250 MG PO TBEC: 750.0000 mg | DELAYED_RELEASE_TABLET | Freq: Two times a day (BID) | ORAL | 4 refills | Status: DC

## 2021-10-26 LAB — COMPREHENSIVE METABOLIC PANEL
AG Ratio: 1.5 (calc) (ref 1.0–2.5)
ALT: 22 U/L (ref 5–32)
AST: 20 U/L (ref 12–32)
Albumin: 4.6 g/dL (ref 3.6–5.1)
Alkaline phosphatase (APISO): 50 U/L (ref 36–128)
BUN: 9 mg/dL (ref 7–20)
CO2: 28 mmol/L (ref 20–32)
Calcium: 9.5 mg/dL (ref 8.9–10.4)
Chloride: 102 mmol/L (ref 98–110)
Creat: 0.56 mg/dL (ref 0.50–0.96)
Globulin: 3 g/dL (calc) (ref 2.0–3.8)
Glucose, Bld: 90 mg/dL (ref 65–99)
Potassium: 3.9 mmol/L (ref 3.8–5.1)
Sodium: 141 mmol/L (ref 135–146)
Total Bilirubin: 0.3 mg/dL (ref 0.2–1.1)
Total Protein: 7.6 g/dL (ref 6.3–8.2)

## 2021-10-26 LAB — CBC WITH DIFFERENTIAL/PLATELET
Absolute Monocytes: 319 cells/uL (ref 200–950)
Basophils Absolute: 11 cells/uL (ref 0–200)
Basophils Relative: 0.3 %
Eosinophils Absolute: 19 cells/uL (ref 15–500)
Eosinophils Relative: 0.5 %
HCT: 41.8 % (ref 35.0–45.0)
Hemoglobin: 12.9 g/dL (ref 11.7–15.5)
Lymphs Abs: 2599 cells/uL (ref 850–3900)
MCH: 23.4 pg — ABNORMAL LOW (ref 27.0–33.0)
MCHC: 30.9 g/dL — ABNORMAL LOW (ref 32.0–36.0)
MCV: 75.9 fL — ABNORMAL LOW (ref 80.0–100.0)
MPV: 11.6 fL (ref 7.5–12.5)
Monocytes Relative: 8.4 %
Neutro Abs: 851 cells/uL — ABNORMAL LOW (ref 1500–7800)
Neutrophils Relative %: 22.4 %
Platelets: 192 10*3/uL (ref 140–400)
RBC: 5.51 10*6/uL — ABNORMAL HIGH (ref 3.80–5.10)
RDW: 14.7 % (ref 11.0–15.0)
Total Lymphocyte: 68.4 %
WBC: 3.8 10*3/uL (ref 3.8–10.8)

## 2021-10-26 LAB — VITAMIN D 25 HYDROXY (VIT D DEFICIENCY, FRACTURES): Vit D, 25-Hydroxy: 25 ng/mL — ABNORMAL LOW (ref 30–100)

## 2021-10-26 LAB — LEVETIRACETAM LEVEL: Keppra (Levetiracetam): 35 ug/mL

## 2021-10-26 LAB — LACOSAMIDE, SERUM/PLASMA: Lacosamide, Serum/Plasma: 7 ug/mL

## 2021-10-26 LAB — VALPROIC ACID LEVEL: Valproic Acid Lvl: 85.6 mg/L (ref 50.0–100.0)

## 2021-11-02 IMAGING — CT CT HEAD W/O CM
3 series · 14 of 47 positions shown, 16 images · non-contrast
Comparison: MRI 03/27/2011

CLINICAL DATA: Seizure, fall

EXAM:
CT HEAD WITHOUT CONTRAST
TECHNIQUE: Contiguous axial images were obtained from the base of the skull
through the vertex without intravenous contrast.

[Series 3: head wo · axial · 0.47mm/px · z∈[-229,-99]mm · 8 of 32 slices shown, 10 images]
[im 3/32  brain]
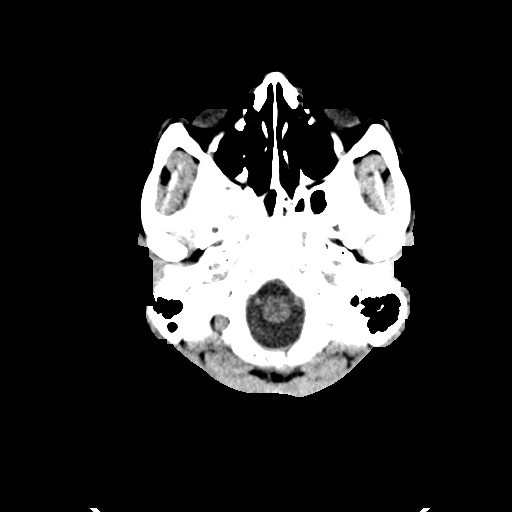
[im 3/32  bone]
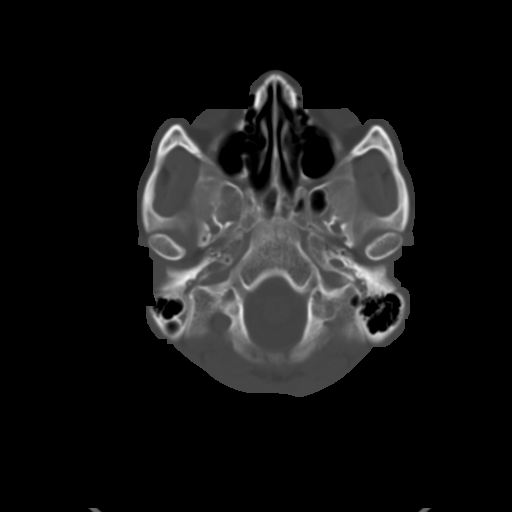
[im 7/32  brain]
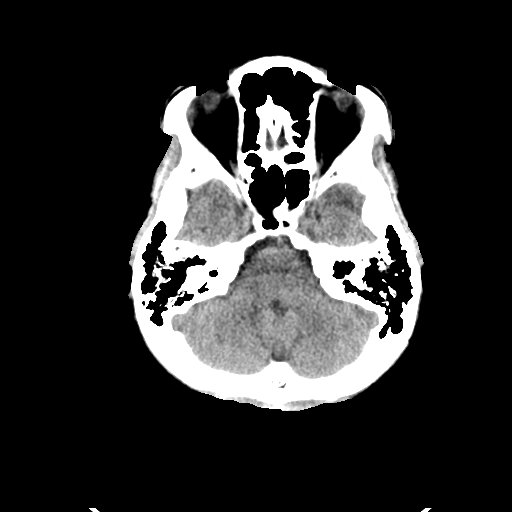
[im 10/32  brain]
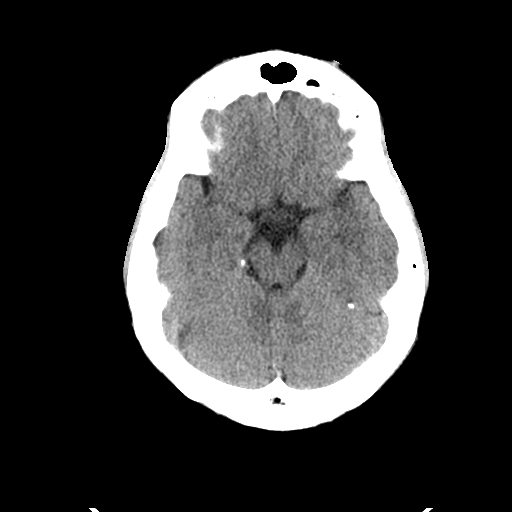
[im 14/32  brain]
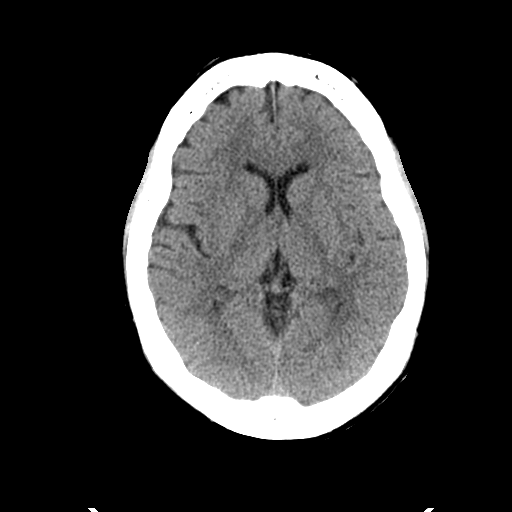
[im 18/32  brain]
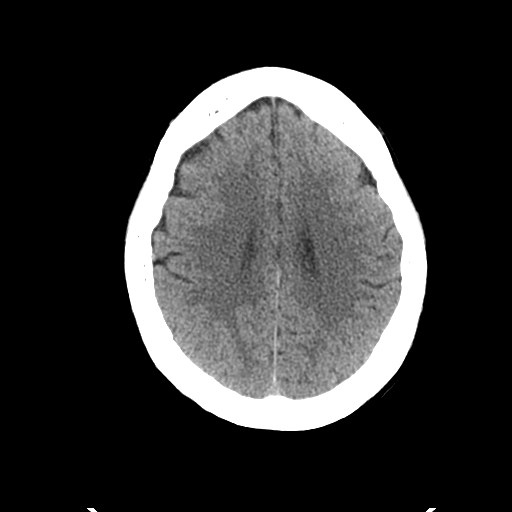
[im 18/32  bone]
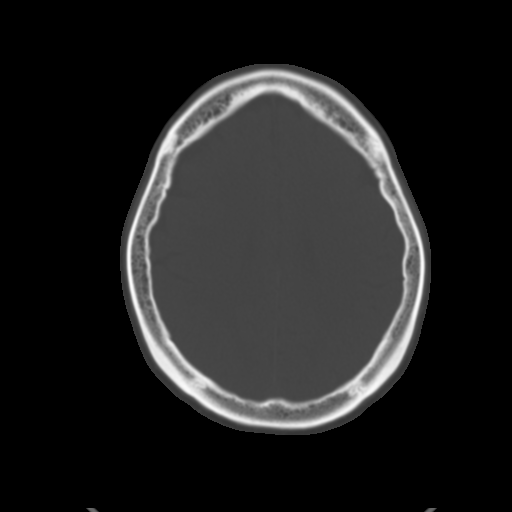
[im 22/32  brain]
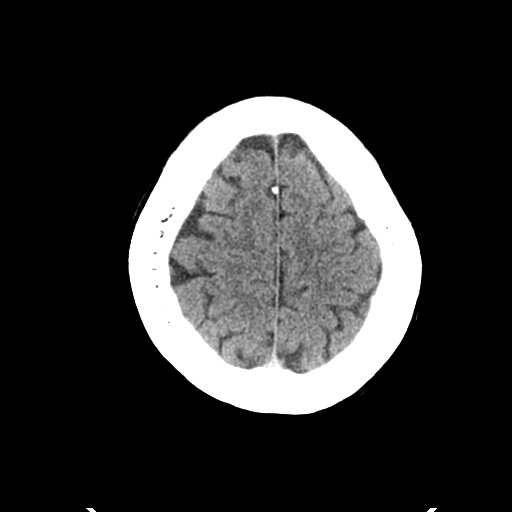
[im 25/32  brain]
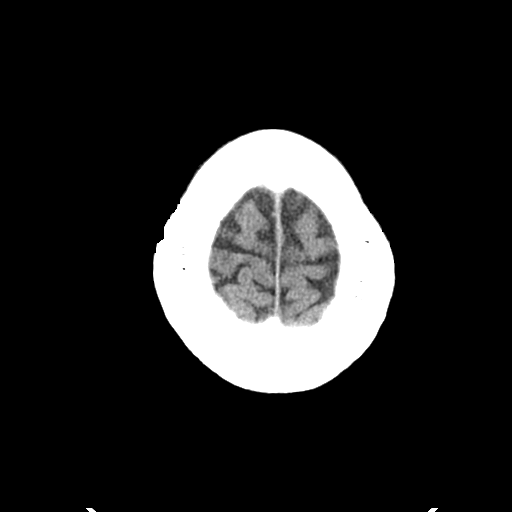
[im 29/32  brain]
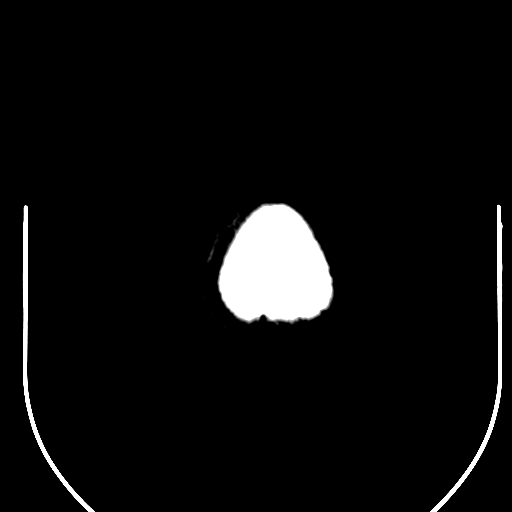

[Series 6: coronal soft tissue · coronal · 0.30mm/px · 3 of 70 slices shown]
[im 24/70  brain]
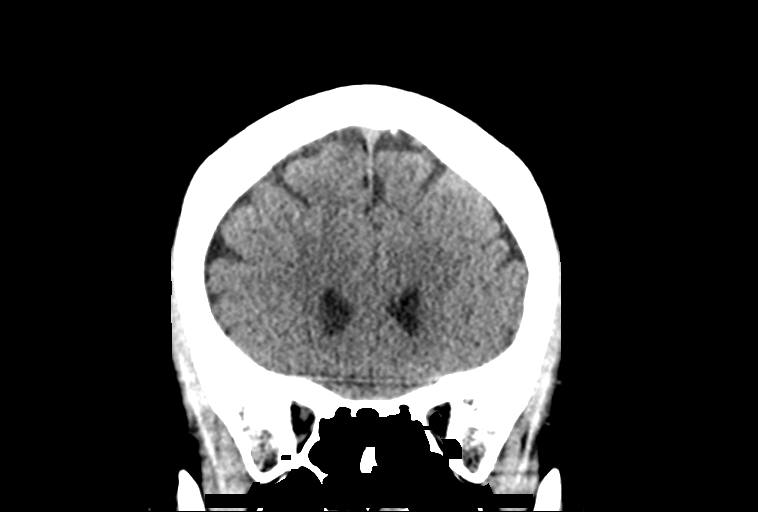
[im 31/70  brain]
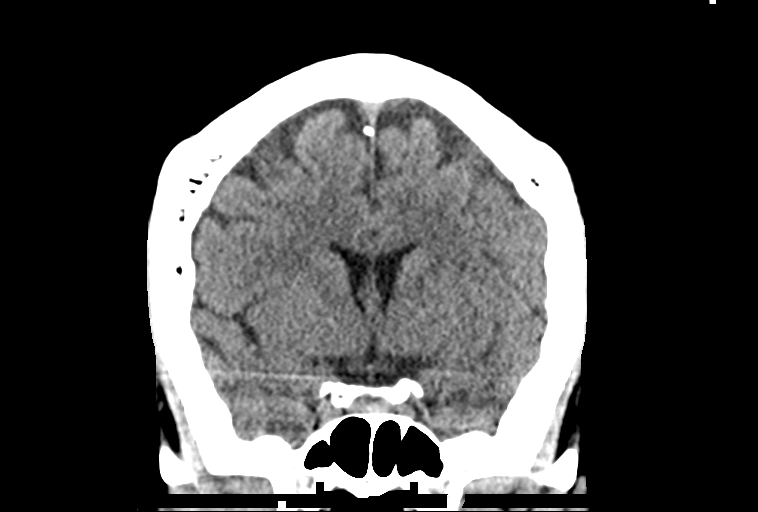
[im 39/70  brain]
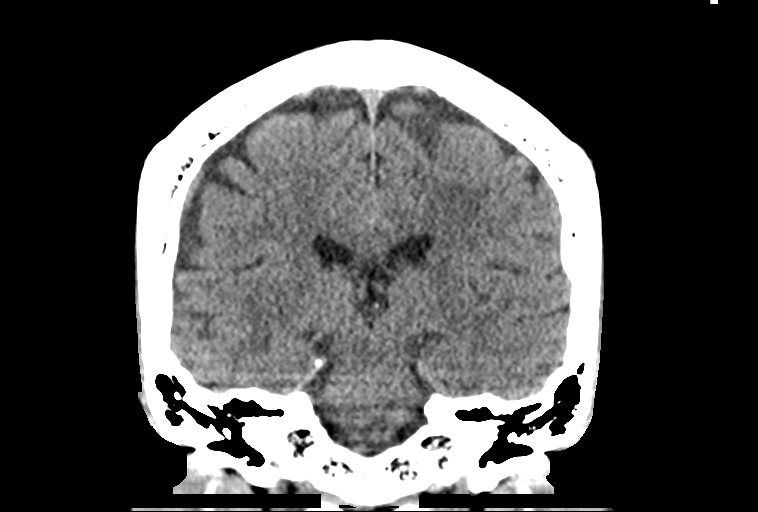

[Series 7: sagittal soft tissue · sagittal · 0.30mm/px · 3 of 56 slices shown]
[im 19/56  brain]
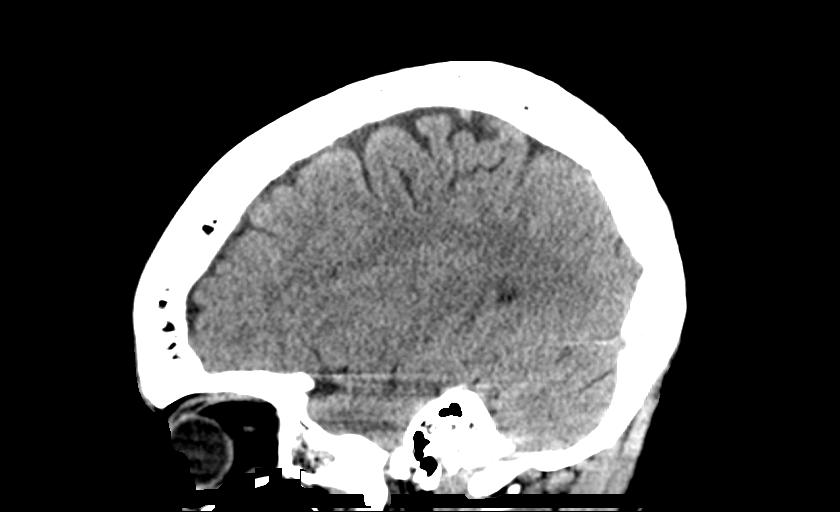
[im 28/56  brain]
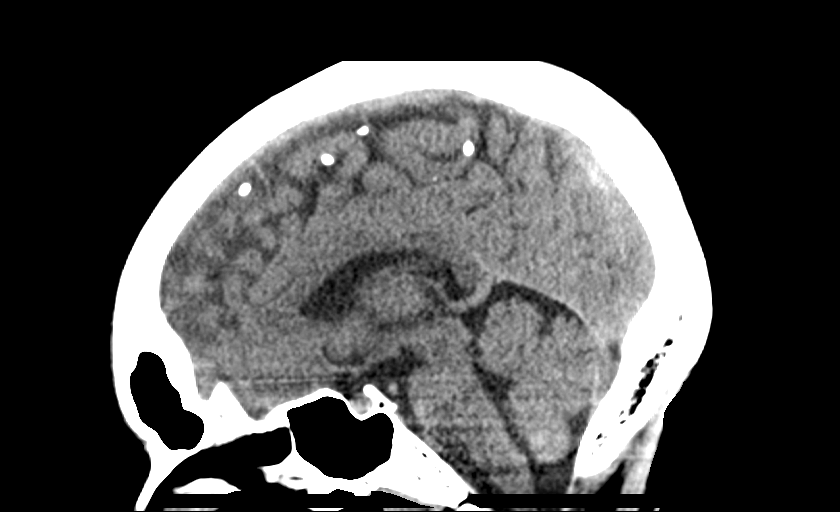
[im 37/56  brain]
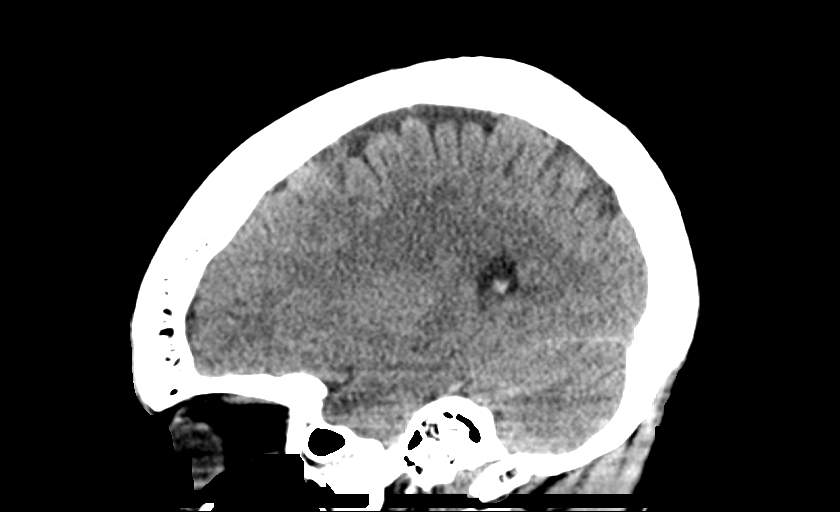

[14 of 47 positions shown; findings below may reference images not displayed]

FINDINGS: Brain: No evidence of acute infarction, hemorrhage, hydrocephalus,
extra-axial collection or mass lesion/mass effect.

Vascular: No hyperdense vessel or unexpected calcification.

Skull: Normal. Negative for fracture or focal lesion.

Sinuses/Orbits: No acute finding.

Other: Negative for scalp hematoma.
IMPRESSION: No acute intracranial findings.

## 2021-11-09 ENCOUNTER — Other Ambulatory Visit (INDEPENDENT_AMBULATORY_CARE_PROVIDER_SITE_OTHER): Payer: Self-pay | Admitting: Family

## 2021-11-13 ENCOUNTER — Other Ambulatory Visit (INDEPENDENT_AMBULATORY_CARE_PROVIDER_SITE_OTHER): Payer: Self-pay | Admitting: Neurology

## 2021-11-13 MED ORDER — DIVALPROEX SODIUM ER 250 MG PO TB24: 750.0000 mg | ORAL_TABLET | Freq: Two times a day (BID) | ORAL | 3 refills | Status: DC

## 2021-11-13 NOTE — Telephone Encounter (Signed)
°  Who's calling (name and relationship to patient) : Duanne Guess; mom  Best contact number: [phone]  Provider they see: Dr. Merri Brunette  Reason for call: Mom has requested a call when its been sent    PRESCRIPTION REFILL ONLY  Name of prescription: Divalproex   Pharmacy:

## 2021-11-14 ENCOUNTER — Telehealth (INDEPENDENT_AMBULATORY_CARE_PROVIDER_SITE_OTHER): Payer: Self-pay | Admitting: Family

## 2021-11-14 NOTE — Telephone Encounter (Signed)
Spoke with mom to let her know that in our system it says that the meditation refill was sen tot the pharmacy yesterday. Mom states she will check with pharmacy. She called [name] because she did not get a call from them to pick up.

## 2021-11-14 NOTE — Telephone Encounter (Signed)
°  Who's calling (name and relationship to patient) : Duanne Guess; mom  Best contact number: [phone]  Provider they see: [facility]  Reason for call: Mom stated she called in yesterday regarding a refill, and prescription still hasn't been filled yet. Mom has requested call back when it is sent in.    PRESCRIPTION REFILL ONLY  Name of prescription: Divalproex  Pharmacy:

## 2021-12-12 ENCOUNTER — Other Ambulatory Visit (INDEPENDENT_AMBULATORY_CARE_PROVIDER_SITE_OTHER): Payer: Self-pay

## 2021-12-12 DIAGNOSIS — F70 Mild intellectual disabilities: Secondary | ICD-10-CM

## 2021-12-12 DIAGNOSIS — G40219 Localization-related (focal) (partial) symptomatic epilepsy and epileptic syndromes with complex partial seizures, intractable, without status epilepticus: Secondary | ICD-10-CM

## 2021-12-12 DIAGNOSIS — G40319 Generalized idiopathic epilepsy and epileptic syndromes, intractable, without status epilepticus: Secondary | ICD-10-CM

## 2021-12-12 MED ORDER — LEVETIRACETAM 750 MG PO TABS: 1500.0000 mg | ORAL_TABLET | Freq: Two times a day (BID) | ORAL | 4 refills | Status: DC

## 2022-01-21 ENCOUNTER — Encounter (INDEPENDENT_AMBULATORY_CARE_PROVIDER_SITE_OTHER): Payer: Self-pay | Admitting: Neurology

## 2022-01-21 ENCOUNTER — Other Ambulatory Visit: Payer: Self-pay

## 2022-01-21 ENCOUNTER — Ambulatory Visit (INDEPENDENT_AMBULATORY_CARE_PROVIDER_SITE_OTHER): Payer: Medicaid Other | Admitting: Neurology

## 2022-01-21 VITALS — BP 102/60 | HR 81 | Wt 95.9 lb

## 2022-01-21 DIAGNOSIS — F70 Mild intellectual disabilities: Secondary | ICD-10-CM

## 2022-01-21 DIAGNOSIS — G44219 Episodic tension-type headache, not intractable: Secondary | ICD-10-CM

## 2022-01-21 DIAGNOSIS — G40219 Localization-related (focal) (partial) symptomatic epilepsy and epileptic syndromes with complex partial seizures, intractable, without status epilepticus: Secondary | ICD-10-CM

## 2022-01-21 DIAGNOSIS — G40319 Generalized idiopathic epilepsy and epileptic syndromes, intractable, without status epilepticus: Secondary | ICD-10-CM

## 2022-01-21 MED ORDER — PHENYTOIN SODIUM EXTENDED 100 MG PO CAPS: ORAL_CAPSULE | ORAL | 5 refills | Status: DC

## 2022-01-21 MED ORDER — DEPAKOTE 250 MG PO TBEC: 750.0000 mg | DELAYED_RELEASE_TABLET | Freq: Two times a day (BID) | ORAL | 5 refills | Status: DC

## 2022-01-21 MED ORDER — NAYZILAM 5 MG/0.1ML NA SOLN: NASAL | 2 refills | Status: DC

## 2022-01-21 MED ORDER — VIMPAT 100 MG PO TABS: ORAL_TABLET | ORAL | 5 refills | Status: DC

## 2022-01-21 MED ORDER — LEVETIRACETAM 750 MG PO TABS: 1500.0000 mg | ORAL_TABLET | Freq: Two times a day (BID) | ORAL | 5 refills | Status: DC

## 2022-01-21 NOTE — Patient Instructions (Signed)
Continue the same dose of seizure medications ?Continue with adequate sleep and limited screen time ?I will send a prescription for Nayzilam as a rescue medication in case of prolonged seizure activity, longer than 5 minutes ?Call my office if there are frequent or prolonged seizures ?Other than that continue follow-up with Dr. Mervyn Skeeters in 5 months ?

## 2022-01-21 NOTE — Progress Notes (Signed)
Patient: Bethany Thompson MRN: [id number] ?Sex: female DOB: 11-24-2000 ? ?Provider: Keturah Shavers, MD ?Location of Care: [facility] ? ?Note type: Routine return visit ? ?Referral Source: [name], MD ?History from: mother and [facility] chart ?Chief Complaint: 1 seizure in the last two weeks, 3 minutes, Emergency med not given. ? ?History of Present Illness: ?Bethany Thompson is a 21 y.o. female is here for follow-up management of seizure disorder. ?She had been initially seen by Dr. Sharene Skeans for a few years and then recently seen by Dr. Mervyn Skeeters who would be her primary neurologist. ?She has a diagnosis of intractable and refractory epilepsy, intellectual disability with different types of seizure activity, currently controlled on 4 AEDs with fairly good dose. ?She was last seen in December 2022 when she was recommended to continue the same dose of medications including Keppra, Vimpat, Depakote, phenytoin. ?She had some blood work done at the end of December which showed Vimpat level of 7, Keppra level of 35, Depakote level of 85.  Phenytoin level was not done.  She did have a fairly normal vitamin D level of 25 which is slightly low. ?Since her last visit in December she has had just 1 clinical seizure activity that lasted for a couple of minutes and resolved spontaneously.  She has had no other issues and mother has no other complaints or concerns at this time. ? ? ?Review of Systems: ?Review of system as per HPI, otherwise negative. ? ?Past Medical History:  ?Diagnosis Date  ? Development delay   ? Moderate intellectual disabilities   ? Seizures (HCC)   ? ?Hospitalizations: No., Head Injury: No., Nervous System Infections: No., Immunizations up to date: Yes.   ? ? ?Surgical History ?History reviewed. No pertinent surgical history. ? ?Family History ?Family history is unknown by patient. ? ? ?Social History ?Social History  ? ?Socioeconomic History  ? Marital status: Single  ?  Spouse name: Not on file  ?  Number of children: Not on file  ? Years of education: Not on file  ? Highest education level: Not on file  ?Occupational History  ? Occupation: na  ?Tobacco Use  ? Smoking status: Never  ?  Passive exposure: Never  ? Smokeless tobacco: Never  ?Vaping Use  ? Vaping Use: Never used  ?Substance and Sexual Activity  ? Alcohol use: No  ?  Alcohol/week: 0.0 standard drinks  ? Drug use: No  ? Sexual activity: Never  ?  Birth control/protection: Abstinence  ?Other Topics Concern  ? Not on file  ?Social History Narrative  ? Graduated High School 2022  ? She had special assistance at school due to developmental delay.  ? She lives with mother, father, 3 brothers, 1 sister.   ? No tobacco exposure, no pets.   ? She enjoys playing video games and going to school  ? ?Social Determinants of Health  ? ?Financial Resource Strain: Not on file  ?Food Insecurity: Not on file  ?Transportation Needs: Not on file  ?Physical Activity: Not on file  ?Stress: Not on file  ?Social Connections: Not on file  ? ? ? ?Allergies  ?Allergen Reactions  ? Benzodiazepines Other (See Comments)  ?  See FYI. Per Dr. Sharene Skeans, recommended IV Keppra for seizure activity. Ativan/benzos makes patient somnolent but typically does not abort/decrease seizure activity.   ? ? ?Physical Exam ?BP 102/60 (BP Location: Left Arm, Patient Position: Sitting, Cuff Size: Small)   Pulse 81   Wt 95 lb 14.4 oz (  43.5 kg)   LMP 01/04/2022 (Exact Date)   HC 24.41" (62 cm)   BMI 18.66 kg/m?  ?Gen: Awake, alert, not in distress, Non-toxic appearance. ?Skin: No neurocutaneous stigmata, no rash ?HEENT: Normocephalic, no dysmorphic features, no conjunctival injection, nares patent, mucous membranes moist, oropharynx clear. ?Neck: Supple, no meningismus, no lymphadenopathy,  ?Resp: Clear to auscultation bilaterally ?CV: Regular rate, normal S1/S2, no murmurs, no rubs ?Abd: Bowel sounds present, abdomen soft, non-tender, non-distended.  No hepatosplenomegaly or mass. ?Ext: Warm  and well-perfused. No deformity, no muscle wasting, ROM full. ? ?Neurological Examination: ?MS- Awake, alert, interactive ?Cranial Nerves- Pupils equal, round and reactive to light (5 to 32mm); fix and follows with full and smooth EOM; no nystagmus; no ptosis, funduscopy with normal sharp discs, visual field full by looking at the toys on the side, face symmetric with smile.  Hearing intact to bell bilaterally, palate elevation is symmetric, and tongue protrusion is symmetric. ?Tone- Normal ?Strength-Seems to have good strength, symmetrically by observation and passive movement. ?Reflexes-  ? ? Biceps Triceps Brachioradialis Patellar Ankle  ?R 2+ 2+ 2+ 2+ 2+  ?L 2+ 2+ 2+ 2+ 2+  ? ?Plantar responses flexor bilaterally, no clonus noted ?Sensation- Withdraw at four limbs to stimuli. ?Coordination- Reached to the object with no dysmetria ? ? ? ?Assessment and Plan ?1. 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 Focal epilepsy with impairment of consciousness, intractable (HCC)   ?3. Mild intellectual disability   ?4. Episodic tension-type headache, not intractable   ? ?This is a 21 year old female with intractable seizure disorder, currently on 4 AEDs with fairly good seizure control and just 1 clinical seizure activity over the past 3 months, tolerating all medications well with no side effects and her drug levels were appropriate. ?Recommend to continue the same dose of AEDs without any change in the dosage. ?She will continue with adequate sleep and limited screen time ?I sent a prescription for Nayzilam in case of prolonged seizure activity ?Mother will call office if she develops any prolonged seizure activity or frequent seizures ?Return in 5 months for follow-up visit or sooner if she develops more seizure activity.  Mother understood and agreed with the plan through the interpreter. ? ? ? ?Meds ordered this encounter  ?Medications  ? phenytoin (DILANTIN) 100 MG ER capsule  ?  Sig: Take 1  capsule (100 mg total) by mouth every morning AND 2 capsules (200 mg total) at bedtime.  ?  Dispense:  93 capsule  ?  Refill:  5  ? levETIRAcetam (KEPPRA) 750 MG tablet  ?  Sig: Take 2 tablets (1,500 mg total) by mouth 2 (two) times daily.  ?  Dispense:  120 tablet  ?  Refill:  5  ? DEPAKOTE 250 MG DR tablet  ?  Sig: Take 3 tablets (750 mg total) by mouth 2 (two) times daily.  ?  Dispense:  186 tablet  ?  Refill:  5  ? VIMPAT 100 MG TABS  ?  Sig: Take 1 tablet (100 mg total) by mouth every morning AND 2 tablets (200 mg total) at bedtime.  ?  Dispense:  90 tablet  ?  Refill:  5  ? NAYZILAM 5 MG/0.1ML SOLN  ?  Sig: Apply 5 mg nasally for seizures lasting longer than 5 minutes  ?  Dispense:  2 each  ?  Refill:  2  ? ?No orders of the defined types were placed in this encounter. ? ?

## 2022-04-29 ENCOUNTER — Other Ambulatory Visit (INDEPENDENT_AMBULATORY_CARE_PROVIDER_SITE_OTHER): Payer: Self-pay

## 2022-04-29 DIAGNOSIS — F70 Mild intellectual disabilities: Secondary | ICD-10-CM

## 2022-04-29 DIAGNOSIS — G40219 Localization-related (focal) (partial) symptomatic epilepsy and epileptic syndromes with complex partial seizures, intractable, without status epilepticus: Secondary | ICD-10-CM

## 2022-04-29 DIAGNOSIS — G40319 Generalized idiopathic epilepsy and epileptic syndromes, intractable, without status epilepticus: Secondary | ICD-10-CM

## 2022-04-30 MED ORDER — PHENYTOIN SODIUM EXTENDED 100 MG PO CAPS: ORAL_CAPSULE | ORAL | 0 refills | Status: DC

## 2022-05-31 ENCOUNTER — Encounter: Payer: Self-pay | Admitting: Pediatrics

## 2022-05-31 DIAGNOSIS — Z1589 Genetic susceptibility to other disease: Secondary | ICD-10-CM | POA: Insufficient documentation

## 2022-06-04 ENCOUNTER — Other Ambulatory Visit (INDEPENDENT_AMBULATORY_CARE_PROVIDER_SITE_OTHER): Payer: Self-pay | Admitting: Pediatrics

## 2022-06-25 ENCOUNTER — Encounter (INDEPENDENT_AMBULATORY_CARE_PROVIDER_SITE_OTHER): Payer: Self-pay | Admitting: Pediatrics

## 2022-06-25 ENCOUNTER — Ambulatory Visit (INDEPENDENT_AMBULATORY_CARE_PROVIDER_SITE_OTHER): Payer: Medicaid Other | Admitting: Pediatrics

## 2022-06-25 VITALS — BP 120/72 | HR 84 | Wt 101.6 lb

## 2022-06-25 DIAGNOSIS — G40319 Generalized idiopathic epilepsy and epileptic syndromes, intractable, without status epilepticus: Secondary | ICD-10-CM | POA: Diagnosis not present

## 2022-06-25 DIAGNOSIS — F70 Mild intellectual disabilities: Secondary | ICD-10-CM | POA: Diagnosis not present

## 2022-06-25 DIAGNOSIS — G40219 Localization-related (focal) (partial) symptomatic epilepsy and epileptic syndromes with complex partial seizures, intractable, without status epilepticus: Secondary | ICD-10-CM

## 2022-06-25 MED ORDER — LEVETIRACETAM 750 MG PO TABS: 1500.0000 mg | ORAL_TABLET | Freq: Two times a day (BID) | ORAL | 3 refills | Status: DC

## 2022-06-25 MED ORDER — PHENYTOIN SODIUM EXTENDED 100 MG PO CAPS: ORAL_CAPSULE | ORAL | 0 refills | Status: DC

## 2022-06-25 MED ORDER — VIMPAT 100 MG PO TABS: ORAL_TABLET | ORAL | 3 refills | Status: DC

## 2022-06-25 MED ORDER — DIVALPROEX SODIUM ER 250 MG PO TB24: 750.0000 mg | ORAL_TABLET | Freq: Two times a day (BID) | ORAL | 3 refills | Status: DC

## 2022-06-25 NOTE — Progress Notes (Signed)
Patient: Bethany Thompson MRN: [id number] Sex: female DOB: [birth year]  Provider: Lezlie Lye, MD Location of Care: [facility] Note type: return visit for follow up Referral Source: [name], MD Date of Evaluation: 06/25/2022 Chief Complaint: refractory epilepsy Visit type: In-person  Bethany Thompson is a 21 y.o. female with history significant for refractory epilepsy and intellectual disability.  Interim History: Patient was evaluated in last visit in April 2023 by Dr. Devonne Doughty.  Mother states that [name] he has been generally healthy since last visit.  Her seizures have been fairly under control with seizure frequency once per month.  Her last seizure occurred last Thursday witnessed by her mother and sister.  Mother described her last seizure as she dazed off approximately 1 minute then closed her eyes and slept afterward.  Mother states that her seizure frequency has improved significantly comparing with years before.  Patient has good quality of life with seizure frequency once every month or couple months.  Patient is taking and tolerating antiseizure medication (phenytoin, Vimpat, valproic acid and Keppra).  Patient is also taking vitamin D supplements with antiseizure medication.  10/22/2021: Lab test did not Lacosamide level was 7 in therapeutic range. Valproic acid level was 85.6 in therapeutic range. Levetiracetam level was 35 in therapeutic range. CMP were within normal range.  CBC revealed mild low MCV, MCH and MCHC.  Hemoglobin 12.9. Vitamin D level was 25 (vitamin D insufficiency).  Recommended vitamin D supplements  Brief history: Copied from previous record: Sharnell was hospitalized 08/14/14-08/16/14 at [facility] due to seizure activity. Prior to that she was hospitalized on January 18, 2014. She has been admitted to the hospital on multiple occasions. She has been the patient of mine since mid-May 2012, when she came to this area  after immigrating from [place]. She had onset of seizures between [age] and four months of age after immunization. At the time, she was initially treated with phenobarbital and Tegretol. Her seizures were generalized tonic-clonic and would occur multiple times per day.    History of significant problems with communication with her mother. She would sometimes allow her to have seizures for several hours intermittently before bringing her to the hospital. We finally convinced her to not allow her to have more than three seizures before presenting to the emergency room. Even with this, once clusters of seizures begin, they tend to persist until she is given very high doses of levetiracetam. She then sleeps for a period of a day or so and begins to return slowly to baseline.    MRI of the brain was normal. EEG shows frontally predominant spikes. She had an electrographic seizure that started with a 12 hertz polyspike activity and became electrodecremental at which time she had coincident generalized tonic-clonic seizure. She has diffuse background slowing.   [facility] March 2013  Requested by Dr. 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   Date of most recent seizure: 06/20/2022 Seizure frequency past month (exact number or average per day): 1 Past 3 months: 3  Current AEDs: Depakote 750 mg BID Vimpat 100 mg in am and 200 mg at night Phenytoin 100 mg in am and 200 mg at  night Keppra 1500 mg BID  Current side effects: None Prior AEDs (d/c reason?): None Other Meds (including OCP): Vitamin D supplements  Adherence Estimate:  Excellent  Past Medical History: Refractory epilepsy Intellectual disability  Past Surgical History: None  Allergies  Allergen Reactions   Benzodiazepines Other (See Comments)    See FYI. Per Dr. Sharene Skeans, recommended IV Keppra for seizure activity. Ativan/benzos makes patient somnolent but typically does not abort/decrease seizure activity.     Medications: Depakote 750 mg BID Vimpat 100 mg in am and 200 mg at night Phenytoin 100 mg in am and 200 mg at night Keppra 1500 mg BID  Birth History Mother reported that she was born full-term via normal vaginal delivery with no perinatal events. Unknown birth weight, length or head circumference.   Developmental history: Intellectual disability.  Schooling: she attends [employer].  She is a Engineer, agricultural. There are no apparent school problems with peers. Still year of school.   Social and family history: she lives with both parents and siblings. she has 3 brothers and 1 sister.  She enjoys playing video games and going to school.  Both parents are in apparent good health. Siblings are also healthy. There is no family history of speech delay, learning difficulties in school, intellectual disability, epilepsy or neuromuscular disorders.   Adolescent history: She has regular menstrual cycle.  Her menstrual period July 2023.  Review of Systems Constitutional: Negative for fever, malaise/fatigue and weight loss.  HENT: Negative for congestion, ear pain, hearing loss, sinus pain and sore throat.   Eyes: Negative for blurred vision, double vision, photophobia, discharge and redness.  Respiratory: Negative for cough, shortness of breath and wheezing.   Cardiovascular: Negative for chest pain, palpitations and leg swelling.  Gastrointestinal: Negative for abdominal pain, blood in stool, constipation, nausea and vomiting.  Genitourinary: Negative for dysuria and frequency.  Musculoskeletal: Negative for back pain, falls, joint pain  and neck pain.  Skin: Negative for rash.  Neurological: Negative for dizziness, tremors, focal weakness, weakness and headaches. Positive for seizures Psychiatric/Behavioral: intellectual disability. The patient is not nervous/anxious and does not have insomnia.   EXAMINATION Physical examination: Today's Vitals   06/25/22 1441  BP: 120/72  Pulse: 84  Weight: 101 lb 9.6 oz (46.1 kg)   Body mass index is 19.76 kg/m.   General examination: she is alert and active in no apparent distress. There are no dysmorphic features. Chest examination reveals normal breath sounds, and normal heart sounds with no cardiac murmur.  Abdominal examination does not show any evidence of hepatic or splenic enlargement, or any abdominal masses or bruits.  Skin evaluation does not reveal any caf-au-lait spots, hypo or hyperpigmented lesions, hemangiomas or pigmented nevi. Neurologic examination: she is awake, alert, cooperative and responsive to some questions when she understands.  she follows all commands readily.  Speech is slow, limited speech output with no echolalia.  Cranial nerves: Pupils are equal, symmetric, circular and reactive to light. Extraocular movements are full in range, with no strabismus.  There is no ptosis or nystagmus.  Facial sensations are intact.  There is no facial asymmetry, with normal facial movements bilaterally.  Hearing is grossly intact. Palatal movements are symmetric.  The tongue is midline. Motor assessment: The tone is normal.  Movements are symmetric in all four extremities, with no evidence of any focal weakness.  Power is 5/5 in all groups of muscles across all major joints.  There is no evidence of atrophy or hypertrophy of  muscles.  Deep tendon reflexes are 2+ and symmetric at the biceps, triceps, knees and ankles.  Plantar response is flexor bilaterally. Sensory examination:  light touch does not reveal any sensory deficits. Co-ordination and gait:  Finger-to-nose testing  is normal bilaterally.  Fine finger movements and rapid alternating movements are within normal range.  Mirror movements are not present.  There is no evidence of tremor, dystonic posturing or any abnormal movements.   Romberg's sign is absent.  Gait is normal with equal arm swing bilaterally and symmetric leg movements.  Heel, toe and tandem walking are within normal range.    Assessment and Plan Maebrie Borjas is a 21 y.o. female with significant history of refractory epilepsy, intellectual disability and SCN Ia mutation who is here for follow-up.  Per mother, her seizures have improved in frequency to once every month.  Mother described her recent seizure as dazing off lasted approximately 1 minute followed by postictal sleepiness.  Patient is taking and tolerating her antiseizure medications of valproic acid, Keppra, Vimpat and phenytoin with no side effects.  Physical neurological examination is stable from the last visit.  Blood lab work revealed therapeutic range of lacosamide, levetiracetam and valproic acid level.  However, phenytoin was not checked for some reason.  No changes were made for her antiseizure medications today.  PLAN: Continue Depakote 750 mg BID Continue Vimpat 100 mg in am and 200 mg at night Continue Phenytoin 100 mg in am and 200 mg at night Continue Keppra 1500 mg BID Continue vitamin D and calcium supplements We have discussed birth control in epilepsy patient taking antiseizure medication.  Transition to adult neurology care.  Follow-up with adult neurology Dr. Delice Lesch in 4 months. May consider sleep deprived EEG before seeing Dr. Delice Lesch   Counseling/Education: seizure safety. Vitamin D and calcium supplement for bone health because she taking antiseizure medications. I have also discussed starting birth control and risk of birth malformation due to AEDs. Mother verbalize understanding.   Total time spent with the patient was 40 minutes, of which 50% or more was spent  in counseling and coordination of care.   The plan of care was discussed, with acknowledgement of understanding expressed by her mother.    Bethany Thompson Neurology and epilepsy attending [facility] Ph. [phone] Fax [phone]

## 2022-08-07 ENCOUNTER — Telehealth (INDEPENDENT_AMBULATORY_CARE_PROVIDER_SITE_OTHER): Payer: Self-pay | Admitting: Pediatrics

## 2022-08-07 NOTE — Telephone Encounter (Signed)
  Name of who is calling: Adele  Caller's Relationship to Patient: mom  Best contact number: [phone]  Provider they see: Dr. Loni Muse  Reason for call: Mom needs a call back in reference ito a prescription refill has been without for 2 days.      PRESCRIPTION REFILL ONLY  Name of prescription:VIMPAT 100 MG TABS  Pharmacy:  [facility] [place] [address]  [place] [address] [place], [place] [phone]

## 2022-08-08 NOTE — Telephone Encounter (Signed)
Spoke with pharmacy to check on medication they state that the rx refill was not received in the facility. I was able to do a verbal and the rx was received. Spoke with mom let her know she states understanding.

## 2022-11-12 ENCOUNTER — Other Ambulatory Visit (INDEPENDENT_AMBULATORY_CARE_PROVIDER_SITE_OTHER): Payer: Self-pay | Admitting: Pediatrics

## 2022-11-12 DIAGNOSIS — G40219 Localization-related (focal) (partial) symptomatic epilepsy and epileptic syndromes with complex partial seizures, intractable, without status epilepticus: Secondary | ICD-10-CM

## 2022-11-12 DIAGNOSIS — F70 Mild intellectual disabilities: Secondary | ICD-10-CM

## 2022-11-12 DIAGNOSIS — G40319 Generalized idiopathic epilepsy and epileptic syndromes, intractable, without status epilepticus: Secondary | ICD-10-CM

## 2022-11-13 ENCOUNTER — Other Ambulatory Visit (INDEPENDENT_AMBULATORY_CARE_PROVIDER_SITE_OTHER): Payer: Self-pay | Admitting: Pediatrics

## 2022-11-13 DIAGNOSIS — G40319 Generalized idiopathic epilepsy and epileptic syndromes, intractable, without status epilepticus: Secondary | ICD-10-CM

## 2022-11-13 DIAGNOSIS — G40219 Localization-related (focal) (partial) symptomatic epilepsy and epileptic syndromes with complex partial seizures, intractable, without status epilepticus: Secondary | ICD-10-CM

## 2022-11-13 DIAGNOSIS — F70 Mild intellectual disabilities: Secondary | ICD-10-CM

## 2022-12-09 ENCOUNTER — Other Ambulatory Visit (INDEPENDENT_AMBULATORY_CARE_PROVIDER_SITE_OTHER): Payer: Self-pay | Admitting: Pediatrics

## 2022-12-09 DIAGNOSIS — F70 Mild intellectual disabilities: Secondary | ICD-10-CM

## 2022-12-09 DIAGNOSIS — G40219 Localization-related (focal) (partial) symptomatic epilepsy and epileptic syndromes with complex partial seizures, intractable, without status epilepticus: Secondary | ICD-10-CM

## 2022-12-09 DIAGNOSIS — G40319 Generalized idiopathic epilepsy and epileptic syndromes, intractable, without status epilepticus: Secondary | ICD-10-CM

## 2022-12-10 ENCOUNTER — Other Ambulatory Visit (INDEPENDENT_AMBULATORY_CARE_PROVIDER_SITE_OTHER): Payer: Self-pay

## 2022-12-10 ENCOUNTER — Telehealth (INDEPENDENT_AMBULATORY_CARE_PROVIDER_SITE_OTHER): Payer: Self-pay | Admitting: Pediatrics

## 2022-12-10 DIAGNOSIS — G40219 Localization-related (focal) (partial) symptomatic epilepsy and epileptic syndromes with complex partial seizures, intractable, without status epilepticus: Secondary | ICD-10-CM

## 2022-12-10 DIAGNOSIS — F70 Mild intellectual disabilities: Secondary | ICD-10-CM

## 2022-12-10 DIAGNOSIS — G40319 Generalized idiopathic epilepsy and epileptic syndromes, intractable, without status epilepticus: Secondary | ICD-10-CM

## 2022-12-10 MED ORDER — DIVALPROEX SODIUM ER 250 MG PO TB24: 750.0000 mg | ORAL_TABLET | Freq: Two times a day (BID) | ORAL | 3 refills | Status: DC

## 2022-12-10 MED ORDER — VIMPAT 100 MG PO TABS: ORAL_TABLET | ORAL | 3 refills | Status: DC

## 2022-12-10 MED ORDER — PHENYTOIN SODIUM EXTENDED 100 MG PO CAPS: ORAL_CAPSULE | ORAL | 0 refills | Status: DC

## 2022-12-10 NOTE — Telephone Encounter (Signed)
  Name of who is calling:Bethany Thompson   Caller's Relationship to Patient:mother   Best contact number:[phone]  Provider they see:Dr. Abdelmoumen   Reason for call:mom called to see if the Vimapt could be called in to hold her over until she has her appointment at [facility]. Mom stated that they never called and when she called them was told that someone would call back but never did. She is calling again today after provided the number and place to where the referral was sent again      [place]  Name of prescription:VIMPAT   Pharmacy:[facility] [address]

## 2022-12-12 ENCOUNTER — Telehealth (INDEPENDENT_AMBULATORY_CARE_PROVIDER_SITE_OTHER): Payer: Self-pay | Admitting: Pediatrics

## 2022-12-12 NOTE — Telephone Encounter (Signed)
  Name of who is calling: Adele  Caller's Relationship to Patient: mom   Best contact number: [phone]  Provider they see: Dr. Loni Muse  Reason for call: Mom came in asking can we send in a refill for vimpat. They haven't been able to get in to see adult neuro yet and pt is out of vimpat.       PRESCRIPTION REFILL ONLY  Name of prescription: vimpat  Pharmacy: [facility] On [address]

## 2022-12-12 NOTE — Telephone Encounter (Signed)
Seen 06/25/2022 waiting for appt with adult neuro. Needs refill on Vimpat Rx sent 1/30 with 3 refills should not need a refill [facility] to [facility] they have rx and is ready for pick up Call to patient rx is ready at [facility]

## 2022-12-17 ENCOUNTER — Telehealth (INDEPENDENT_AMBULATORY_CARE_PROVIDER_SITE_OTHER): Payer: Self-pay | Admitting: Pediatrics

## 2022-12-17 NOTE — Telephone Encounter (Signed)
  Name of who is calling: [name]   Caller's Relationship to Patient: Mother  Best contact number: [phone]  Provider they see: Abdelmoumen   Reason for call: Mom would like an updated referral to see an adult neurologist for her daughter.      PRESCRIPTION REFILL ONLY  Name of prescription:  Pharmacy:

## 2022-12-17 NOTE — Telephone Encounter (Signed)
Please call [facility] to help this patient schedule an appointment

## 2022-12-19 ENCOUNTER — Other Ambulatory Visit (INDEPENDENT_AMBULATORY_CARE_PROVIDER_SITE_OTHER): Payer: Medicaid Other

## 2022-12-19 ENCOUNTER — Encounter: Payer: Self-pay | Admitting: Neurology

## 2022-12-19 ENCOUNTER — Ambulatory Visit (INDEPENDENT_AMBULATORY_CARE_PROVIDER_SITE_OTHER): Payer: Medicaid Other | Admitting: Neurology

## 2022-12-19 VITALS — BP 119/81 | HR 79 | Wt 105.4 lb

## 2022-12-19 DIAGNOSIS — G40919 Epilepsy, unspecified, intractable, without status epilepticus: Secondary | ICD-10-CM

## 2022-12-19 LAB — COMPREHENSIVE METABOLIC PANEL
ALT: 26 U/L (ref 0–35)
AST: 21 U/L (ref 0–37)
Albumin: 4.5 g/dL (ref 3.5–5.2)
Alkaline Phosphatase: 48 U/L (ref 39–117)
BUN: 11 mg/dL (ref 6–23)
CO2: 26 mEq/L (ref 19–32)
Calcium: 9.6 mg/dL (ref 8.4–10.5)
Chloride: 102 mEq/L (ref 96–112)
Creatinine, Ser: 0.5 mg/dL (ref 0.40–1.20)
GFR: 134.64 mL/min (ref >60.00)
Glucose, Bld: 89 mg/dL (ref 70–99)
Potassium: 4.2 mEq/L (ref 3.5–5.1)
Sodium: 141 mEq/L (ref 135–145)
Total Bilirubin: 0.3 mg/dL (ref 0.2–1.2)
Total Protein: 7.6 g/dL (ref 6.0–8.3)

## 2022-12-19 LAB — CBC
HCT: 38.8 % (ref 36.0–46.0)
Hemoglobin: 12.5 g/dL (ref 12.0–15.0)
MCHC: 32.1 g/dL (ref 30.0–36.0)
MCV: 74 fl — ABNORMAL LOW (ref 78.0–100.0)
Platelets: 167 10*3/uL (ref 150.0–400.0)
RBC: 5.25 Mil/uL — ABNORMAL HIGH (ref 3.87–5.11)
RDW: 15.7 % — ABNORMAL HIGH (ref 11.5–14.6)
WBC: 4.4 10*3/uL — ABNORMAL LOW (ref 4.5–10.5)

## 2022-12-19 LAB — VITAMIN D 25 HYDROXY (VIT D DEFICIENCY, FRACTURES): VITD: 21.56 ng/mL — ABNORMAL LOW (ref 30.00–100.00)

## 2022-12-19 MED ORDER — VIMPAT 100 MG PO TABS: ORAL_TABLET | ORAL | 5 refills | Status: DC

## 2022-12-19 MED ORDER — DIVALPROEX SODIUM ER 250 MG PO TB24: 750.0000 mg | ORAL_TABLET | Freq: Two times a day (BID) | ORAL | 3 refills | Status: DC

## 2022-12-19 MED ORDER — LEVETIRACETAM 750 MG PO TABS: ORAL_TABLET | ORAL | 3 refills | Status: DC

## 2022-12-19 MED ORDER — PHENYTOIN SODIUM EXTENDED 100 MG PO CAPS: ORAL_CAPSULE | ORAL | 3 refills | Status: DC

## 2022-12-19 NOTE — Progress Notes (Signed)
NEUROLOGY CONSULTATION NOTE  Bethany Thompson MRN: [id number] DOB: [birth year]  Referring provider: Dr. Franco Thompson Primary care provider: Dr. Virgel Thompson  Reason for consult:  establish adult epilepsy care  Dear Dr Bethany Thompson:  Thank you for your kind referral of Bethany Thompson for consultation of the above symptoms. Although her history is well known to you, please allow me to reiterate it for the purpose of our medical record. The patient was accompanied to the clinic by her mother Bethany Thompson who provides collateral information. A [place] medical interpreter, Bethany Thompson, is present to help with translation. Records and images were personally reviewed where available.   HISTORY OF PRESENT ILLNESS: This is a pleasant 22 year old right-handed woman with a history of intellectual disability, refractory epilepsy with SCNA 1A variant, presenting to establish adult epilepsy care. Records from pediatric neurology were reviewed. She started seeing Dr. Gaynell Thompson in 2012 after immigrating from [place]. Seizures started at 86 months of age after immunization. Per notes, [facility] March 2013  requested by Dr. Virgina Thompson showed "C.[id number]  Considered to be SCN 1A "variant, likely disease-causing." She was initially treated with phenobarbital and Tegretol. She was having generalized convulsions multiple times a day. Per notes, Dr. Gaynell Thompson had significant problems with communication with her mother, she would sometimes allow Bethany Thompson to have seizures for several hours intermittently before bringing her to the hospital. At that point, seizure clusters would persist until she would be given very high doses of Levetiracetam. Ativan is listed on her allergies, benzodiazepines would make her very somnolent without seizure cessation. Her mother reports that she has seizures on a daily basis. Initially mother reported that all her seizures start with staring followed by shaking, however on further questioning, she mostly has  staring spells where she dazes off for 1 minute, closes her eyes, then sleeps after. These were initially occurring once a month, however in December and January, they started occurring more frequently. Last week she was having them daily, last focal seizure was yesterday. No tongue bite or incontinence. She would be tired after and would not talk for hours. She is on Depakote ER 771m BID, brand Vimpat 1064min AM, 20039mn PM, Phenytoin 100m64m AM, 200mg36mPM, and Levetiracetam 1500mg 26m No side effects, she has not been on higher doses. Mother has not used the nasal spray.   Bethany Thompson pleasant and answer with 1-word responses. She goes to school daily. She denies any symptoms, mother agrees. She has regular periods, there is no catamenial component to her seizures. Mother also reports nocturnal seizures.    Laboratory Data 10/2021: Lacosamide level was 7 in therapeutic range. Valproic acid level was 85.6 in therapeutic range. Levetiracetam level was 35 in therapeutic range. CMP were within normal range.  CBC revealed mild low MCV, MCH and MCHC.  Hemoglobin 12.9. Vitamin D level was 25 (vitamin D insufficiency).   Diagnostic Data: MRI of the brain with and without contrast 2012 unremarkable, hippocampi symmetric.  EEG 2012: sharply contoured slow wave activity at F4, F8, Fp1 and Fp2, also generalized bursts of diphasic spike and slow wave discharge; 40-second seizure with polyphasic 12 Hz spike activity followed by decremental actvity, eyes open on right side with full body rhythmic jerking  EEG 08/2013: solitary bifrontal sharply contoured slow wave at both central regions and generalized spike and slow wave discharge    PAST MEDICAL HISTORY: Past Medical History:  Diagnosis Date   Development delay    Moderate intellectual disabilities  Seizures ([place])     PAST SURGICAL HISTORY: History reviewed. No pertinent surgical history.  MEDICATIONS: Current Outpatient  Medications on File Prior to Visit  Medication Sig Dispense Refill   acetaminophen (TYLENOL) 160 MG/5ML solution Take 160 mg by mouth daily as needed for headache.     divalproex (DEPAKOTE ER) 250 MG 24 hr tablet Take 3 tablets (750 mg total) by mouth 2 (two) times daily. 186 tablet 3   levETIRAcetam (KEPPRA) 750 MG tablet TAKE 2 TABLETS(1500 MG) BY MOUTH TWICE DAILY 120 tablet 3   NAYZILAM 5 MG/0.1ML SOLN Apply 5 mg nasally for seizures lasting longer than 5 minutes 2 each 2   phenytoin (DILANTIN) 100 MG ER capsule Take 1 capsule (100 mg total) by mouth every morning AND 2 capsules (200 mg total) at bedtime. 90 capsule 0   VIMPAT 100 MG TABS Take 1 tablet (100 mg total) by mouth every morning AND 2 tablets (200 mg total) at bedtime. 90 tablet 3   [DISCONTINUED] phenytoin (DILANTIN) 50 MG tablet Chew 2 tablets (100 mg total) by mouth 2 (two) times daily. 120 tablet 0   No current facility-administered medications on file prior to visit.    ALLERGIES: Allergies  Allergen Reactions   Benzodiazepines Other (See Comments)    See FYI. Per Dr. Gaynell Thompson, recommended IV Keppra for seizure activity. Ativan/benzos makes patient somnolent but typically does not abort/decrease seizure activity.   Mom stated that she is not allergic     FAMILY HISTORY: Family History  Problem Relation Age of Onset   Seizures Neg Hx     SOCIAL HISTORY: Social History   Socioeconomic History   Marital status: Single    Spouse name: Not on file   Number of children: Not on file   Years of education: Not on file   Highest education level: Not on file  Occupational History   Occupation: na  Tobacco Use   Smoking status: Never    Passive exposure: Never   Smokeless tobacco: Never  Vaping Use   Vaping Use: Never used  Substance and Sexual Activity   Alcohol use: No    Alcohol/week: 0.0 standard drinks of alcohol   Drug use: No   Sexual activity: Never    Birth control/protection: Abstinence  Other  Topics Concern   Not on file  Social History Narrative   Graduated High School 2022   She had special assistance at school due to developmental delay.   She lives with mother, father, 3 brothers, 1 sister.    No tobacco exposure, no pets.    She enjoys playing video games and going to school   Social Determinants of Health   Financial Resource Strain: Not on file  Food Insecurity: Not on file  Transportation Needs: Not on file  Physical Activity: Not on file  Stress: Not on file  Social Connections: Not on file  Intimate Partner Violence: Not on file     PHYSICAL EXAM: Vitals:   12/19/22 0912  BP: 119/81  Pulse: 79  SpO2: 100%   General: No acute distress Head:  Normocephalic/atraumatic Skin/Extremities: No rash, no edema Neurological Exam: Mental status: alert and awake, answers with 1-word responses, follows commands.  Cranial nerves: CN I: not tested CN II: pupils equal, round, visual fields intact CN III, IV, VI:  full range of motion, no nystagmus, no ptosis CN V: facial sensation intact CN VII: upper and lower Thompson symmetric CN VIII: hearing intact to conversation Bulk & Tone: normal, no  fasciculations. Motor: 5/5 throughout with no pronator drift. Deep Tendon Reflexes: +2 throughout Cerebellar: no incoordination on finger to nose testing Gait: narrow-based and steady, no ataxia Tremor: none   IMPRESSION: This is a pleasant 22 year old right-handed woman with a history of intellectual disability, refractory epilepsy with SCNA 1A variant, presenting to establish adult epilepsy care. Her mother reports daily focal seizures with impaired awareness lasting for 1 minute, seizures increased in frequency the past 2 months. We discussed that there is room to increase Depakote, or consider adding on clobazam. Her mother feels she is on too much medication and does not want to make any changes at this time. Typically sodium channel blockers are avoided in seizure  management of SCN1A mutations, however she has been on Phenytoin and Vimpat for a while and mother is not interested in changing. Safety labs with CBC, CMP, vitamin D level will be ordered. Continue close supervision. Follow-up in 3 months, call for any changes.    Thank you for allowing me to participate in the care of this patient. Please do not hesitate to call for any questions or concerns.   Ellouise Newer, M.D.  CC: Dr. Vilma Prader, Dr. Coralie Thompson

## 2022-12-19 NOTE — Patient Instructions (Addendum)
Ravi de Gaffer.  1. Faites effectuer des analyses de sang pour [employer] niveaux de CBC, CMP et vitamine D.  2. Continuez tous vos mdicaments  3. Suivi dans 3 mois, appelez pour tout changement   Prcautions contre les crises : 1. Si un mdicament vous a t prescrit pour prvenir les convulsions, prenez-le exactement comme indiqu. N'arrtez pas de prendre le mdicament sans en parler au pralable  votre mdecin, mme si vous n'avez pas eu de crise depuis longtemps.  2. vitez les activits dans lesquelles une crise pourrait prsenter un danger pour vous-mme ou pour autrui. N'utilisez pas de Education officer, environmental, ne nagez pas seul et ne grimpez pas dans des endroits levs ou dangereux, comme sur des [place], des toits ou des poutres. Ne conduisez pas sauf si votre mdecin vous Conservation officer, historic buildings.  3. Si vous recevez un avertissement indiquant que vous pourriez avoir une crise, allongez-vous dans un endroit sr o vous ne pouvez pas vous blesser.  4. Interdiction de conduire pendant 6 mois  compter de la dernire saisie, conformment  la loi de l'tat de [employer]. Veuillez consulter le lien suivant sur le site Web [place] pour plus d'informations : http://www.epilepsyfoundation.org/answerplace/Social/driving/drivingu.cfm  5. Maintenez une bonne hygine de sommeil.  6. Informez votre neurologie si vous envisagez une grossesse ou si vous tombez enceinte.  7. Contactez votre mdecin si vous rencontrez des problmes pouvant tre lis au mdicament que vous prenez.  8. Appelez le 911 et ramenez le patient aux urgences si :       A. La crise dure plus de 5 minutes. [employer] patient ne se rveille pas peu de temps aprs la crise C. Le patient a de nouveaux problmes tels que des difficults  voir,  parler ou  bouger D. Le patient a t bless lors de la crise E. Le patient a une temprature suprieure  102 F (39C) F. Le patient a vomi et a  Pharmacist, community mal  respirer    Good to meet you.  Have bloodwork done for CBC, CMP, vitamin D level  2. Continue all your medications  3. Follow-up in 3 months, call for any changes   Seizure Precautions: 1. If medication has been prescribed for you to prevent seizures, take it exactly as directed.  Do not stop taking the medicine without talking to your doctor first, even if you have not had a seizure in a long time.   2. Avoid activities in which a seizure would cause danger to yourself or to others.  Don't operate dangerous machinery, swim alone, or climb in high or dangerous places, such as on ladders, roofs, or girders.  Do not drive unless your doctor says you may.  3. If you have any warning that you may have a seizure, lay down in a safe place where you can't hurt yourself.    4.  No driving for 6 months from last seizure, as per [facility].   Please refer to the following link on the [place] website for more information: http://www.epilepsyfoundation.org/answerplace/Social/driving/drivingu.cfm   5.  Maintain good sleep hygiene.  6.  Notify your neurology if you are planning pregnancy or if you become pregnant.  7.  Contact your doctor if you have any problems that may be related to the medicine you are taking.  8.  Call 911 and bring the patient back to the ED if:        A.  The  seizure lasts longer than 5 minutes.       B.  The patient doesn't awaken shortly after the seizure  C.  The patient has new problems such as difficulty seeing, speaking or moving  D.  The patient was injured during the seizure  E.  The patient has a temperature over 102 F (39C)  F.  The patient vomited and now is having trouble breathing

## 2022-12-26 MED ORDER — VITAMIN D (ERGOCALCIFEROL) 1.25 MG (50000 UNIT) PO CAPS: ORAL_CAPSULE | ORAL | 0 refills | Status: AC

## 2023-01-29 ENCOUNTER — Other Ambulatory Visit: Payer: Self-pay | Admitting: Physician Assistant

## 2023-01-29 DIAGNOSIS — N644 Mastodynia: Secondary | ICD-10-CM

## 2023-01-29 DIAGNOSIS — N6311 Unspecified lump in the right breast, upper outer quadrant: Secondary | ICD-10-CM

## 2023-02-17 ENCOUNTER — Ambulatory Visit
Admission: RE | Admit: 2023-02-17 | Discharge: 2023-02-17 | Disposition: A | Discharge status: Home / Self Care | Payer: Medicaid Other | Source: Ambulatory Visit | Attending: Physician Assistant | Admitting: Physician Assistant

## 2023-02-17 ENCOUNTER — Other Ambulatory Visit: Payer: Self-pay | Admitting: Physician Assistant

## 2023-02-17 DIAGNOSIS — N6310 Unspecified lump in the right breast, unspecified quadrant: Secondary | ICD-10-CM

## 2023-02-17 DIAGNOSIS — N6311 Unspecified lump in the right breast, upper outer quadrant: Secondary | ICD-10-CM

## 2023-02-17 DIAGNOSIS — N644 Mastodynia: Secondary | ICD-10-CM

## 2023-02-17 DIAGNOSIS — N6320 Unspecified lump in the left breast, unspecified quadrant: Secondary | ICD-10-CM

## 2023-03-19 ENCOUNTER — Ambulatory Visit: Payer: Medicaid Other | Admitting: Neurology

## 2023-03-20 ENCOUNTER — Encounter: Payer: Self-pay | Admitting: Neurology

## 2023-03-20 ENCOUNTER — Ambulatory Visit: Payer: Medicaid Other | Admitting: Neurology

## 2023-03-20 DIAGNOSIS — G40919 Epilepsy, unspecified, intractable, without status epilepticus: Secondary | ICD-10-CM

## 2023-03-20 MED ORDER — VIMPAT 100 MG PO TABS: ORAL_TABLET | ORAL | 5 refills | Status: DC

## 2023-03-20 MED ORDER — PHENYTOIN SODIUM EXTENDED 100 MG PO CAPS: ORAL_CAPSULE | ORAL | 3 refills | Status: DC

## 2023-03-20 MED ORDER — DIVALPROEX SODIUM ER 250 MG PO TB24: 750.0000 mg | ORAL_TABLET | Freq: Two times a day (BID) | ORAL | 3 refills | Status: DC

## 2023-03-20 MED ORDER — LEVETIRACETAM 750 MG PO TABS: ORAL_TABLET | ORAL | 3 refills | Status: DC

## 2023-03-20 NOTE — Patient Instructions (Addendum)
Content de voir que tu vas bien. Continuez tous vos mdicaments. Suivi dans 3 mois, appeler pour [employer].   Prcautions contre les crises : 1. Si un mdicament vous a t prescrit pour prvenir les convulsions, prenez-le exactement comme indiqu. N'arrtez pas de prendre le mdicament sans en parler au pralable  votre mdecin, mme si vous n'avez pas eu de crise depuis longtemps.  2. vitez les activits dans lesquelles une crise pourrait prsenter un danger pour vous-mme ou pour autrui. N'utilisez pas de Lawyer, ne nagez pas seul et ne grimpez pas dans des endroits levs ou dangereux, comme sur des [place], des toits ou des poutres. Ne conduisez pas  moins que votre mdecin ne vous l'autorise.  3. Si vous recevez un avertissement indiquant que vous pourriez avoir une crise, allongez-vous dans un endroit sr o vous ne pouvez pas vous blesser.  4. Interdiction de conduire pendant 6 mois  compter de la dernire saisie, conformment  la loi de l'tat de [employer]. Veuillez vous rfrer au lien suivant sur le site Web de [employer] of [place] pour plus d'informations : http://www.epilepsyfoundation.org/answerplace/Social/driving/drivingu.cfm  5. Maintenez une bonne hygine de sommeil.  6. Informez votre neurologie si vous envisagez une grossesse ou si vous tombez enceinte.  7. Contactez votre mdecin si vous rencontrez des problmes pouvant tre lis au mdicament que vous prenez.  8. Appelez le 911 et ramenez le patient aux urgences si :       A. La crise dure plus de 5 minutes. BConley Thompson patient ne se rveille pas peu de temps aprs la crise C. Le patient a de nouveaux problmes tels que des difficults  voir,  parler ou  bouger D. Le patient a t bless lors de la crise E. Le patient a une temprature suprieure  102 F (39C) F. Le patient a vomi et a Teacher, early years/pre mal  respirer    Good to see you doing well. Continue all your medications.  Follow-up in 3 months, call for any changes.    Seizure Precautions: 1. If medication has been prescribed for you to prevent seizures, take it exactly as directed.  Do not stop taking the medicine without talking to your doctor first, even if you have not had a seizure in a long time.   2. Avoid activities in which a seizure would cause danger to yourself or to others.  Don't operate dangerous machinery, swim alone, or climb in high or dangerous places, such as on ladders, roofs, or girders.  Do not drive unless your doctor says you may.  3. If you have any warning that you may have a seizure, lay down in a safe place where you can't hurt yourself.    4.  No driving for 6 months from last seizure, as per [facility].   Please refer to the following link on the Epilepsy Foundation of America's website for more information: http://www.epilepsyfoundation.org/answerplace/Social/driving/drivingu.cfm   5.  Maintain good sleep hygiene.  6.  Notify your neurology if you are planning pregnancy or if you become pregnant.  7.  Contact your doctor if you have any problems that may be related to the medicine you are taking.  8.  Call 911 and bring the patient back to the ED if:        A.  The seizure lasts longer than 5 minutes.       B.  The patient doesn't awaken shortly after the seizure  C.  The patient has  new problems such as difficulty seeing, speaking or moving  D.  The patient was injured during the seizure  E.  The patient has a temperature over 102 F (39C)  F.  The patient vomited and now is having trouble breathing

## 2023-03-20 NOTE — Progress Notes (Signed)
NEUROLOGY FOLLOW UP OFFICE NOTE  Bethany Thompson [id number] 2001/01/26  HISTORY OF PRESENT ILLNESS: I had the pleasure of seeing Bethany Thompson in follow-up in the neurology clinic on 03/20/2023. She is again accompanied by her mother Bethany Thompson who helps supplement the history today. [name] medical interpreter, Bethany Thompson, is present to help with translation however her mother speaks [place]. 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 Her mother was previously reporting an increase in staring spells, however in the past 3 months, she has had only one last 4/10.She has not needed the Nayzilam. No convulsions. No side effects on medications. They are satisfied with current medication regimen. She has not had any complaints. She denies any headaches, dizziness, focal weakness. No falls. Sleep and appetite are good. No behavioral changes.   Laboratory Data: Lab Results  Component Value Date   WBC 4.4 (L) 12/19/2022   HGB 12.5 12/19/2022   HCT 38.8 12/19/2022   MCV 74.0 (L) 12/19/2022   PLT 167.0 12/19/2022     Chemistry      Component Value Date/Time   NA 141 12/19/2022 1008   K 4.2 12/19/2022 1008   CL 102 12/19/2022 1008   CO2 26 12/19/2022 1008   BUN 11 12/19/2022 1008   CREATININE 0.50 12/19/2022 1008   CREATININE 0.56 10/22/2021 0752      Component Value Date/Time   CALCIUM 9.6 12/19/2022 1008   ALKPHOS 48 12/19/2022 1008   AST 21 12/19/2022 1008   ALT 26 12/19/2022 1008   BILITOT 0.3 12/19/2022 1008     Vitamin D level 21.56 in 12/2022; repeat in 01/2023 normal.  History on Initial Assessment 12/19/2022: This is a pleasant 22 year old right-handed woman with a history of intellectual disability, refractory epilepsy with SCNA 1A variant, presenting to establish adult epilepsy care. Records  from pediatric neurology were reviewed. She started seeing Dr. Sharene Skeans in 2012 after immigrating from [place]. Seizures started at 8 months of age after immunization. Per notes, [facility] March 2013  requested by Dr. Romelle Starcher showed "C.827A>T  Considered to be SCN 1A "variant, likely disease-causing." She was initially treated with phenobarbital and Tegretol. She was having generalized convulsions multiple times a day. Per notes, Dr. Sharene Skeans had significant problems with communication with her mother, she would sometimes allow Reese to have seizures for several hours intermittently before bringing her to the hospital. At that point, seizure clusters would persist until she would be given very high doses of Levetiracetam. Ativan is listed on her allergies, benzodiazepines would make her very somnolent without seizure cessation. Her mother reports that she has seizures on a daily basis. Initially mother reported that all her seizures start with staring followed by shaking, however on further questioning, she mostly has staring spells where she dazes off for 1 minute, closes her eyes, then sleeps after. These were initially occurring once a month, however in December and January, they started occurring more frequently. Last week she was having them daily, last focal seizure was yesterday. No tongue bite or incontinence. She would be tired after and would not talk for hours. She is on Depakote ER 750mg  BID, brand Vimpat 100mg  in AM, 200mg  in PM, Phenytoin 100mg  in AM, 200mg  in PM, and Levetiracetam 1500mg  BID. No side effects, she has not been on  higher doses. Mother has not used the nasal spray.   [place] is very pleasant and answer with 1-word responses. She goes to school daily. She denies any symptoms, mother agrees. She has regular periods, there is no catamenial component to her seizures. Mother also reports nocturnal seizures.    Laboratory Data 10/2021: Lacosamide level was 7 in therapeutic  range. Valproic acid level was 85.6 in therapeutic range. Levetiracetam level was 35 in therapeutic range. CMP were within normal range.  CBC revealed mild low MCV, MCH and MCHC.  Hemoglobin 12.9. Vitamin D level was 25 (vitamin D insufficiency).   Diagnostic Data: MRI of the brain with and without contrast 2012 unremarkable, hippocampi symmetric.  EEG 2012: sharply contoured slow wave activity at F4, F8, Fp1 and Fp2, also generalized bursts of diphasic spike and slow wave discharge; 40-second seizure with polyphasic 12 Hz spike activity followed by decremental actvity, eyes open on right side with full body rhythmic jerking  EEG 08/2013: solitary bifrontal sharply contoured slow wave at both central regions and generalized spike and slow wave discharge  PAST MEDICAL HISTORY: Past Medical History:  Diagnosis Date   Development delay    Moderate intellectual disabilities    Seizures (HCC)     MEDICATIONS: Current Outpatient Medications on File Prior to Visit  Medication Sig Dispense Refill   acetaminophen (TYLENOL) 160 MG/5ML solution Take 160 mg by mouth daily as needed for headache.     divalproex (DEPAKOTE ER) 250 MG 24 hr tablet Take 3 tablets (750 mg total) by mouth 2 (two) times daily. 540 tablet 3   levETIRAcetam (KEPPRA) 750 MG tablet TAKE 2 TABLETS(1500 MG) BY MOUTH TWICE DAILY 360 tablet 3   NAYZILAM 5 MG/0.1ML SOLN Apply 5 mg nasally for seizures lasting longer than 5 minutes 2 each 2   phenytoin (DILANTIN) 100 MG ER capsule Take 1 capsule (100 mg total) by mouth every morning AND 2 capsules (200 mg total) at bedtime 270 capsule 3   VIMPAT 100 MG TABS Take 1 tablet (100 mg total) by mouth every morning AND 2 tablets (200 mg total) at bedtime. 90 tablet 5   Vitamin D, Ergocalciferol, (DRISDOL) 1.25 MG (50000 UNIT) CAPS capsule Take 1 capsule (50,000 Units total) by mouth every 7 (seven) days for 8 weeks. 8 capsule 0   [DISCONTINUED] phenytoin (DILANTIN) 50 MG tablet Chew 2  tablets (100 mg total) by mouth 2 (two) times daily. 120 tablet 0   No current facility-administered medications on file prior to visit.    ALLERGIES: Allergies  Allergen Reactions   Benzodiazepines Other (See Comments)    See FYI. Per Dr. Sharene Skeans, recommended IV Keppra for seizure activity. Ativan/benzos makes patient somnolent but typically does not abort/decrease seizure activity.   Mom stated that she is not allergic     FAMILY HISTORY: Family History  Problem Relation Age of Onset   Seizures Neg Hx     SOCIAL HISTORY: Social History   Socioeconomic History   Marital status: Single    Spouse name: Not on file   Number of children: Not on file   Years of education: Not on file   Highest education level: Not on file  Occupational History   Occupation: na  Tobacco Use   Smoking status: Never    Passive exposure: Never   Smokeless tobacco: Never  Vaping Use   Vaping Use: Never used  Substance and Sexual Activity   Alcohol use: No    Alcohol/week: 0.0 standard drinks of alcohol  Drug use: No   Sexual activity: Never    Birth control/protection: Abstinence  Other Topics Concern   Not on file  Social History Narrative   Graduated High School 2022   She had special assistance at school due to developmental delay.   She lives with mother, father, 3 brothers, 1 sister.    No tobacco exposure, no pets.    She enjoys playing video games and going to school   Social Determinants of Health   Financial Resource Strain: Not on file  Food Insecurity: Not on file  Transportation Needs: Not on file  Physical Activity: Not on file  Stress: Not on file  Social Connections: Not on file  Intimate Partner Violence: Not on file     PHYSICAL EXAM: Vitals:   03/20/23 0823  BP: 109/67  Pulse: 80  SpO2: 98%   General: No acute distress Head:  Normocephalic/atraumatic Skin/Extremities: No rash, no edema Neurological Exam: alert and awake. Replies with 1-word answers  but speaks to mother in a sentence in [place]. No dysarthria. Attention and concentration are normal.   Cranial nerves: Pupils equal, round. Extraocular movements intact with no nystagmus. Visual fields full.  No facial asymmetry.  Motor: Bulk and tone normal, muscle strength 5/5 throughout with no pronator drift.   Finger to nose testing intact.  Gait slightly wide-based, no ataxia. No tremors.    IMPRESSION: This is a pleasant 22 yo RH woman with a history of intellectual disability, refractory epilepsy with SCNA 1A variant, doing well on current regimen. Her mother reports one focal seizure in the past 3 months, no convulsions. Continue Depakote ER 750mg  BID, brand Vimpat 100mg  in AM, 200mg  in PM, Phenytoin 100mg  in AM, 200mg  in PM, and Levetiracetam 1500mg  BID. Typically sodium channel blockers are avoided in seizure management of SCN1A mutations, however she has been on Phenytoin and Vimpat for a while and they are satisfied with current regimen. Continue close supervision. Follow-up in 3 months, call for any changes.   Thank you for allowing me to participate in her care.  Please do not hesitate to call for any questions or concerns.    Patrcia Dolly, M.D.   CC: Dr. Sabino Dick

## 2023-04-10 ENCOUNTER — Encounter (HOSPITAL_COMMUNITY): Payer: Self-pay

## 2023-04-10 ENCOUNTER — Emergency Department (HOSPITAL_COMMUNITY)
Admission: EM | Admit: 2023-04-10 | Discharge: 2023-04-11 | Disposition: A | Discharge status: Home / Self Care | Payer: Medicaid Other | Attending: Emergency Medicine | Admitting: Emergency Medicine

## 2023-04-10 DIAGNOSIS — T161XXA Foreign body in right ear, initial encounter: Secondary | ICD-10-CM | POA: Diagnosis present

## 2023-04-10 DIAGNOSIS — X58XXXA Exposure to other specified factors, initial encounter: Secondary | ICD-10-CM | POA: Diagnosis not present

## 2023-04-10 NOTE — ED Provider Notes (Signed)
[facility]EMERGENCY DEPT [facility] Emergency Department Provider Note MRN:  [id number]  Arrival date & time: 04/10/23     Chief Complaint   Foreign Body in Ear   History of Present Illness   Bethany Thompson is a 22 y.o. year-old female presents to the ED with chief complaint of foreign body in right ear.  Uncertain how long it has been present.  Mother noticed it today when trying to clean the ear.  History of developmental delay.  History provided by family.   Review of Systems  Pertinent positive and negative review of systems noted in HPI.    Physical Exam   Vitals:   04/10/23 2338  BP: 130/79  Pulse: 88  Resp: 18  Temp: 98.2 F (36.8 C)  SpO2: 100%    CONSTITUTIONAL:  well-appearing, NAD NEURO:  Alert and oriented x 3, CN 3-12 grossly intact EYES:  eyes equal and reactive ENT/NECK:  Supple, no stridor, q-tip in right ear canal CARDIO:  appears well-perfused  PULM:  No respiratory distress,  GI/GU:  non-distended,  MSK/SPINE:  No gross deformities, no edema, moves all extremities  SKIN:  no rash, atraumatic   *Additional and/or pertinent findings included in MDM below  Diagnostic and Interventional Summary    EKG Interpretation  Date/Time:    Ventricular Rate:    PR Interval:    QRS Duration:   QT Interval:    QTC Calculation:   R Axis:     Text Interpretation:         Labs Reviewed - No data to display  No orders to display    Medications - No data to display   Procedures  /  Critical Care .Foreign Body Removal  Date/Time: 04/10/2023 11:54 PM  Performed by: Roxy Horseman, PA-C Authorized by: Roxy Horseman, PA-C  Consent: Verbal consent obtained. Risks and benefits: risks, benefits and alternatives were discussed Consent given by: parent Patient understanding: patient states understanding of the procedure being performed Patient consent: the patient's understanding of the procedure matches consent given Procedure consent: procedure  consent matches procedure scheduled Relevant documents: relevant documents present and verified Test results: test results available and properly labeled Site marked: the operative site was marked Imaging studies: imaging studies available Body area: ear Location details: right ear Patient restrained: no Patient cooperative: yes Localization method: visualized Removal mechanism: pickups. Complexity: simple 1 objects recovered. Objects recovered: q-tip Post-procedure assessment: foreign body removed Patient tolerance: patient tolerated the procedure well with no immediate complications Comments: Ear canal and TM with only mild erythema, no bleeding or sign of infection    ED Course and Medical Decision Making  I have reviewed the triage vital signs, the nursing notes, and pertinent available records from the EMR.  Social Determinants Affecting Complexity of Care: Patient has no clinically significant social determinants affecting this chief complaint..   ED Course:    Medical Decision Making    Consultants: No consultations were needed in caring for this patient.   Treatment and Plan: Emergency department workup does not suggest an emergent condition requiring admission or immediate intervention beyond  what has been performed at this time. The patient is safe for discharge and has  been instructed to return immediately for worsening symptoms, change in  symptoms or any other concerns    Final Clinical Impressions(s) / ED Diagnoses     ICD-10-CM   1. Foreign body of right ear, initial encounter  T16.1XXA       ED Discharge Orders  None         Discharge Instructions Discussed with and Provided to Patient:   Discharge Instructions   None      Roxy Horseman, Cordelia Poche 04/10/23 2356    Zadie Rhine, MD 04/11/23 [phone]

## 2023-04-10 NOTE — ED Triage Notes (Signed)
Pt presents to ED for possibly cotton in right ear. Pt has reported increasing pain for several days.

## 2023-04-11 NOTE — ED Notes (Signed)
Pt family verbalized understanding discharge instructions. Pt ambulated from ed with family.

## 2023-06-19 ENCOUNTER — Other Ambulatory Visit: Payer: Self-pay | Admitting: Neurology

## 2023-06-19 ENCOUNTER — Telehealth: Payer: Self-pay | Admitting: Neurology

## 2023-06-19 DIAGNOSIS — G40919 Epilepsy, unspecified, intractable, without status epilepticus: Secondary | ICD-10-CM

## 2023-06-19 MED ORDER — VIMPAT 100 MG PO TABS
ORAL_TABLET | ORAL | 0 refills | Status: DC
Start: 2023-06-19 — End: 2023-07-01

## 2023-06-19 NOTE — Telephone Encounter (Signed)
Left message with the after hour service on 06-15-23  at 1:33 pm  Caller states  that the RX must say the brand name medically necessary   vimpat RX

## 2023-06-19 NOTE — Progress Notes (Signed)
Rec'd call from oncall service that pharmacy requesting vimpat to be specified that pt need brand name only.  30-day supply sent.

## 2023-06-20 NOTE — Telephone Encounter (Signed)
Pls confirm, but that is the Rx Dr. Allena Katz sent yesterday, brand name Vimpat. Is there an issue? Thanks

## 2023-06-20 NOTE — Telephone Encounter (Signed)
RX was sent in yesterday for name brand medication for vimpat

## 2023-07-01 ENCOUNTER — Ambulatory Visit (INDEPENDENT_AMBULATORY_CARE_PROVIDER_SITE_OTHER): Payer: MEDICAID | Admitting: Neurology

## 2023-07-01 ENCOUNTER — Encounter: Payer: Self-pay | Admitting: Neurology

## 2023-07-01 VITALS — BP 117/75 | HR 75 | Ht 60.0 in | Wt 104.4 lb

## 2023-07-01 DIAGNOSIS — G40919 Epilepsy, unspecified, intractable, without status epilepticus: Secondary | ICD-10-CM

## 2023-07-01 MED ORDER — VIMPAT 100 MG PO TABS
ORAL_TABLET | ORAL | 3 refills | Status: DC
Start: 2023-07-01 — End: 2023-10-31

## 2023-07-01 MED ORDER — LEVETIRACETAM 750 MG PO TABS
ORAL_TABLET | ORAL | 3 refills | Status: DC
Start: 2023-07-01 — End: 2023-10-31

## 2023-07-01 MED ORDER — DIVALPROEX SODIUM ER 250 MG PO TB24
750.0000 mg | ORAL_TABLET | Freq: Two times a day (BID) | ORAL | 3 refills | Status: DC
Start: 2023-07-01 — End: 2023-10-31

## 2023-07-01 MED ORDER — PHENYTOIN SODIUM EXTENDED 100 MG PO CAPS
ORAL_CAPSULE | ORAL | 3 refills | Status: DC
Start: 2023-07-01 — End: 2023-10-31

## 2023-07-01 NOTE — Patient Instructions (Addendum)
Good to see you. Continue all your medications. Continue to monitor seizures. Follow-up in 4-5 months, call for any changes   Seizure Precautions: 1. If medication has been prescribed for you to prevent seizures, take it exactly as directed.  Do not stop taking the medicine without talking to your doctor first, even if you have not had a seizure in a long time.   2. Avoid activities in which a seizure would cause danger to yourself or to others.  Don't operate dangerous machinery, swim alone, or climb in high or dangerous places, such as on ladders, roofs, or girders.  Do not drive unless your doctor says you may.  3. If you have any warning that you may have a seizure, lay down in a safe place where you can't hurt yourself.    4.  No driving for 6 months from last seizure, as per [facility].   Please refer to the following link on the Epilepsy Foundation of America's website for more information: http://www.epilepsyfoundation.org/answerplace/Social/driving/drivingu.cfm   5.  Maintain good sleep hygiene.  6.  Notify your neurology if you are planning pregnancy or if you become pregnant.  7.  Contact your doctor if you have any problems that may be related to the medicine you are taking.  8.  Call 911 and bring the patient back to the ED if:        A.  The seizure lasts longer than 5 minutes.       B.  The patient doesn't awaken shortly after the seizure  C.  The patient has new problems such as difficulty seeing, speaking or moving  D.  The patient was injured during the seizure  E.  The patient has a temperature over 102 F (39C)  F.  The patient vomited and now is having trouble breathing         Content de vous voir. Continuez tous vos mdicaments. Continuez  surveiller les crises. Suivi dans 4-5 mois, appeler pour tout changement   Prcautions contre les crises : 1. Si un mdicament vous a t prescrit pour prvenir les convulsions, prenez-le exactement comme  indiqu.  N'arrtez pas de prendre le mdicament sans en parler au pralable  votre mdecin, mme si vous n'avez pas eu de crise depuis longtemps.   2. vitez les activits dans lesquelles une crise pourrait prsenter un danger pour vous-mme ou pour autrui.  N'utilisez pas de Lawyer, ne nagez pas seul et ne grimpez pas dans des endroits levs ou dangereux, comme sur des [place], des toits ou des poutres.  Ne conduisez pas sauf si votre mdecin vous Manufacturing systems engineer.  3. Si vous recevez un avertissement indiquant que vous pourriez avoir une crise, allongez-vous dans un endroit sr o vous ne pouvez pas vous blesser.    4. Interdiction de conduire pendant 6 mois  compter de la dernire saisie, conformment  la loi de l'tat de [employer].   Veuillez consulter le lien suivant sur le site Web de [employer] of [place] pour plus d'informations : http://www.epilepsyfoundation.org/answerplace/Social/driving/drivingu.cfm   5. Maintenez une bonne hygine de sommeil.  6. Informez votre neurologie si vous envisagez une grossesse ou si vous tombez enceinte.  7. Contactez votre mdecin si vous rencontrez des problmes pouvant tre lis au mdicament que vous prenez.  8. Appelez le 911 et ramenez le patient aux urgences si :        A. La crise dure plus de 5 minutes.  Ernestine Mcmurray patient ne se rveille pas peu de temps aprs la crise  C. Le patient a de nouveaux problmes tels que des difficults  voir,  parler ou  bouger  D. Le patient a t bless lors de la crise  E. Le patient a une temprature suprieure  102 F (39C)  F. Le patient a vomi et a Teacher, early years/pre mal  respirer

## 2023-07-01 NOTE — Progress Notes (Signed)
NEUROLOGY FOLLOW UP OFFICE NOTE  Bethany Thompson [id number] Sep 03, 2001  HISTORY OF PRESENT ILLNESS: I had the pleasure of seeing Bethany Thompson in follow-up in the neurology clinic on 07/01/2023.  The patient was last seen 3 months ago for intractable epilepsy. She is again accompanied by her mother [name] who helps supplement the history today.  [name] medical interpreter [name] is present to help with translation however her mother speaks [place]. Records and images were personally reviewed where available. She has a history of SCN1A variant, currently on Depakote ER 750mg  BID, brand Vimpat 100mg  in AM, 200mg  in PM, Phenytoin 100mg  in AM, 200mg  in PM, and Levetiracetam 1500mg  BID. Since her last visit, her mother reports one brief focal seizure in June, then another last Friday, and one in the car today. No convulsive activity, tongue bite, or incontinence. She states she has a headache and feels a little bit tired after the seizures. She also reports that her stomach gets upset or there is a bitter taste when she takes her medications. Mother reports she has to take her medications early so she drinks juice or milk with her medications. She is on Depakote ER 750mg  BID, brand Vimpat 100mg  in AM, 200mg  in PM, Phenytoin 100mg  in AM, 200mg  in PM, and Levetiracetam 1500mg  BID.   History on Initial Assessment 12/19/2022: This is a pleasant 22 year old right-handed woman with a history of intellectual disability, refractory epilepsy with SCNA 1A variant, presenting to establish adult epilepsy care. Records from pediatric neurology were reviewed. She started seeing Dr. Sharene Skeans in 2012 after immigrating from [place]. Seizures started at 85 months of age after immunization. Per notes, [facility] March 2013  requested by Dr. Romelle Starcher showed "C.827A>T  Considered to be SCN 1A "variant, likely disease-causing." She was initially treated with phenobarbital and Tegretol. She was having generalized convulsions multiple  times a day. Per notes, Dr. Sharene Skeans had significant problems with communication with her mother, she would sometimes allow [name] to have seizures for several hours intermittently before bringing her to the hospital. At that point, seizure clusters would persist until she would be given very high doses of Levetiracetam. Ativan is listed on her allergies, benzodiazepines would make her very somnolent without seizure cessation. Her mother reports that she has seizures on a daily basis. Initially mother reported that all her seizures start with staring followed by shaking, however on further questioning, she mostly has staring spells where she dazes off for 1 minute, closes her eyes, then sleeps after. These were initially occurring once a month, however in December and January, they started occurring more frequently. Last week she was having them daily, last focal seizure was yesterday. No tongue bite or incontinence. She would be tired after and would not talk for hours. She is on Depakote ER 750mg  BID, brand Vimpat 100mg  in AM, 200mg  in PM, Phenytoin 100mg  in AM, 200mg  in PM, and Levetiracetam 1500mg  BID. No side effects, she has not been on higher doses. Mother has not used the nasal spray.   Bethany Thompson is very pleasant and answer with 1-word responses. She goes to school daily. She denies any symptoms, mother agrees. She has regular periods, there is no catamenial component to her seizures. Mother also reports nocturnal seizures.    Laboratory Data 10/2021: Lacosamide level was 7 in therapeutic range. Valproic acid level was 85.6 in therapeutic range. Levetiracetam level was 35 in therapeutic range. CMP were within normal range.  CBC revealed mild low MCV, MCH and MCHC.  Hemoglobin 12.9. Vitamin D level was 25 (vitamin D insufficiency).   Diagnostic Data: MRI of the brain with and without contrast 2012 unremarkable, hippocampi symmetric.  EEG 2012: sharply contoured slow wave activity at F4, F8, Fp1  and Fp2, also generalized bursts of diphasic spike and slow wave discharge; 40-second seizure with polyphasic 12 Hz spike activity followed by decremental actvity, eyes open on right side with full body rhythmic jerking  EEG 08/2013: solitary bifrontal sharply contoured slow wave at both central regions and generalized spike and slow wave discharge   PAST MEDICAL HISTORY: Past Medical History:  Diagnosis Date   Development delay    Moderate intellectual disabilities    Seizures (HCC)     MEDICATIONS: Current Outpatient Medications on File Prior to Visit  Medication Sig Dispense Refill   acetaminophen (TYLENOL) 160 MG/5ML solution Take 160 mg by mouth daily as needed for headache.     divalproex (DEPAKOTE ER) 250 MG 24 hr tablet Take 3 tablets (750 mg total) by mouth 2 (two) times daily. 540 tablet 3   levETIRAcetam (KEPPRA) 750 MG tablet TAKE 2 TABLETS(1500 MG) BY MOUTH TWICE DAILY 360 tablet 3   NAYZILAM 5 MG/0.1ML SOLN Apply 5 mg nasally for seizures lasting longer than 5 minutes 2 each 2   phenytoin (DILANTIN) 100 MG ER capsule Take 1 capsule (100 mg total) by mouth every morning AND 2 capsules (200 mg total) at bedtime 270 capsule 3   VIMPAT 100 MG TABS Take 1 tablet (100 mg total) by mouth every morning AND 2 tablets (200 mg total) at bedtime. 90 tablet 0   Vitamin D, Ergocalciferol, (DRISDOL) 1.25 MG (50000 UNIT) CAPS capsule Take 1 capsule (50,000 Units total) by mouth every 7 (seven) days for 8 weeks. (Patient not taking: Reported on 07/01/2023) 8 capsule 0   [DISCONTINUED] phenytoin (DILANTIN) 50 MG tablet Chew 2 tablets (100 mg total) by mouth 2 (two) times daily. 120 tablet 0   No current facility-administered medications on file prior to visit.    ALLERGIES: Allergies  Allergen Reactions   Benzodiazepines Other (See Comments)    See FYI. Per Dr. Sharene Skeans, recommended IV Keppra for seizure activity. Ativan/benzos makes patient somnolent but typically does not abort/decrease  seizure activity.   Mom stated that she is not allergic     FAMILY HISTORY: Family History  Problem Relation Age of Onset   Seizures Neg Hx     SOCIAL HISTORY: Social History   Socioeconomic History   Marital status: Single    Spouse name: Not on file   Number of children: Not on file   Years of education: Not on file   Highest education level: Not on file  Occupational History   Occupation: na  Tobacco Use   Smoking status: Never    Passive exposure: Never   Smokeless tobacco: Never  Vaping Use   Vaping status: Never Used  Substance and Sexual Activity   Alcohol use: No    Alcohol/week: 0.0 standard drinks of alcohol   Drug use: No   Sexual activity: Never    Birth control/protection: Abstinence  Other Topics Concern   Not on file  Social History Narrative   Graduated High School 2022   She had special assistance at school due to developmental delay.   She lives with mother, father, 3 brothers, 1 sister.    No tobacco exposure, no pets.    She enjoys playing video games and going to school   Right handed  Social Determinants of Health   Financial Resource Strain: Not on File (02/28/2022)   [facility]   [employer]    Financial Resource Strain: 0  Food Insecurity: Not at Risk (01/17/2023)   Received from [place], [place]   Food Insecurity    Food: 1  Transportation Needs: Not at Risk (01/17/2023)   Received from [place], [employer] Needs    Transportation: 1  Physical Activity: Not on File (02/28/2022)   Received from [place], [place]   Physical Activity    Physical Activity: 0  Stress: Not on File (02/28/2022)   Received from [facility], [place]   Stress    Stress: 0  Social Connections: Not on File (02/28/2022)   Received from [place], [place]   Social Connections    Social Connections and Isolation: 0  Intimate Partner Violence: Not on file     PHYSICAL EXAM: Vitals:   07/01/23 1542  BP: 117/75  Pulse: 75  SpO2: 100%    General: No acute distress Head:  Normocephalic/atraumatic Skin/Extremities: No rash, no edema Neurological Exam: alert and awake. More interactive and talkative today. No aphasia or dysarthria. Fund of knowledge is reduced. Attention and concentration are normal.   Cranial nerves: Pupils equal, round. Extraocular movements intact with no nystagmus. Visual fields full.  No facial asymmetry.  Motor: Bulk and tone normal, muscle strength 5/5 throughout with no pronator drift.   Finger to nose testing intact.  Gait narrow-based and steady, no ataxia.   IMPRESSION: This is a pleasant 22 yo RH woman with a history of intellectual disability, refractory epilepsy with SCNA 1A variant, doing well on current regimen. Her mother reports 3 brief focal seizures in the past 3 months, no convulsions. We agreed to continue on current regimen, refills sent for Depakote ER 750mg  BID, brand Vimpat 100mg  in AM, 200mg  in PM, Phenytoin 100mg  in AM, 200mg  in PM, and Levetiracetam 1500mg  BID. Typically sodium channel blockers are avoided in seizure management of SCN1A mutations, however she has been on Phenytoin and Vimpat for a while and they are satisfied with current regimen. Continue seizure calendar. She was advised to take medications with food and see if this helps with GI symptoms. Follow-up in 4-5 months, call for any changes.    Thank you for allowing me to participate in her care.  Please do not hesitate to call for any questions or concerns.    Patrcia Dolly, M.D.   CC: Dr. Sabino Dick

## 2023-08-20 ENCOUNTER — Other Ambulatory Visit: Payer: Medicaid Other

## 2023-08-25 ENCOUNTER — Ambulatory Visit
Admission: RE | Admit: 2023-08-25 | Discharge: 2023-08-25 | Disposition: A | Discharge status: Home / Self Care | Payer: MEDICAID | Source: Ambulatory Visit | Attending: Physician Assistant | Admitting: Physician Assistant

## 2023-08-25 DIAGNOSIS — N6310 Unspecified lump in the right breast, unspecified quadrant: Secondary | ICD-10-CM

## 2023-08-25 DIAGNOSIS — N6320 Unspecified lump in the left breast, unspecified quadrant: Secondary | ICD-10-CM

## 2023-08-26 ENCOUNTER — Other Ambulatory Visit: Payer: Self-pay | Admitting: Physician Assistant

## 2023-08-26 DIAGNOSIS — N6310 Unspecified lump in the right breast, unspecified quadrant: Secondary | ICD-10-CM

## 2023-08-26 DIAGNOSIS — N6320 Unspecified lump in the left breast, unspecified quadrant: Secondary | ICD-10-CM

## 2023-10-31 ENCOUNTER — Encounter: Payer: Self-pay | Admitting: Neurology

## 2023-10-31 ENCOUNTER — Ambulatory Visit: Payer: MEDICAID | Admitting: Neurology

## 2023-10-31 VITALS — BP 129/81 | HR 68 | Ht 62.0 in | Wt 108.4 lb

## 2023-10-31 DIAGNOSIS — G40919 Epilepsy, unspecified, intractable, without status epilepticus: Secondary | ICD-10-CM | POA: Diagnosis not present

## 2023-10-31 MED ORDER — PHENYTOIN SODIUM EXTENDED 100 MG PO CAPS: ORAL_CAPSULE | ORAL | 3 refills | Status: DC

## 2023-10-31 MED ORDER — DIVALPROEX SODIUM ER 250 MG PO TB24: 750.0000 mg | ORAL_TABLET | Freq: Two times a day (BID) | ORAL | 3 refills | Status: DC

## 2023-10-31 MED ORDER — LEVETIRACETAM 750 MG PO TABS: ORAL_TABLET | ORAL | 3 refills | Status: DC

## 2023-10-31 MED ORDER — VIMPAT 100 MG PO TABS: ORAL_TABLET | ORAL | 3 refills | Status: DC

## 2023-10-31 NOTE — Patient Instructions (Signed)
Good to see you!  Try taking your medications with yogurt and see if this helps with swallowing and stomach pain. Continue all medications. Follow-up in 6 months, call for any changes   Seizure Precautions: 1. If medication has been prescribed for you to prevent seizures, take it exactly as directed.  Do not stop taking the medicine without talking to your doctor first, even if you have not had a seizure in a long time.   2. Avoid activities in which a seizure would cause danger to yourself or to others.  Don't operate dangerous machinery, swim alone, or climb in high or dangerous places, such as on ladders, roofs, or girders.  Do not drive unless your doctor says you may.  3. If you have any warning that you may have a seizure, lay down in a safe place where you can't hurt yourself.    4.  No driving for 6 months from last seizure, as per [facility].   Please refer to the following link on the Epilepsy Foundation of America's website for more information: http://www.epilepsyfoundation.org/answerplace/Social/driving/drivingu.cfm   5.  Maintain good sleep hygiene.  6.  Notify your neurology if you are planning pregnancy or if you become pregnant.  7.  Contact your doctor if you have any problems that may be related to the medicine you are taking.  8.  Call 911 and bring the patient back to the ED if:        A.  The seizure lasts longer than 5 minutes.       B.  The patient doesn't awaken shortly after the seizure  C.  The patient has new problems such as difficulty seeing, speaking or moving  D.  The patient was injured during the seizure  E.  The patient has a temperature over 102 F (39C)  F.  The patient vomited and now is having trouble breathing

## 2023-10-31 NOTE — Progress Notes (Signed)
NEUROLOGY FOLLOW UP OFFICE NOTE  Bethany Thompson [id number] 10/04/2001  HISTORY OF PRESENT ILLNESS: I had the pleasure of seeing Bethany Thompson in follow-up in the neurology clinic on 10/31/2023.  The patient was last seen 4 months ago for intractable epilepsy. She is again accompanied by her mother Bethany Thompson who helps supplement the history today. Records and images were personally reviewed where available.  Since her last visit, her mother reports she is overall doing well, they are satisfied with seizure control. She has not had any convulsions in a while. She had one focal seizure with staring/behavioral arrest last Monday, it was brief then she went to sleep. She is able to speak in full sentences in [place] to her mother, reporting pain when swallowing, occurring daily. She also reports stomach pain and decreased appetite. She is on Depakote ER 750mg  BID, brand Vimpat 100mg  in AM, 200mg  in PM, Phenytoin 100mg  in AM, 200mg  in PM, and Levetiracetam 1500mg  BID. She denies any headaches, dizziness, focal numbness/tingling/weakness, no falls. Sleep is good.    History on Initial Assessment 12/19/2022: This is a pleasant 22 year old right-handed woman with a history of intellectual disability, refractory epilepsy with SCNA 1A variant, presenting to establish adult epilepsy care. Records from pediatric neurology were reviewed. She started seeing Dr. Sharene Thompson in 2012 after immigrating from [place]. Seizures started at 34 months of age after immunization. Per notes, [facility] March 2013  requested by Dr. Romelle Thompson showed "C.827A>T  Considered to be SCN 1A "variant, likely disease-causing." She was initially treated with phenobarbital and Tegretol. She was having generalized convulsions multiple times a day. Per notes, Dr. Sharene Thompson had significant problems with communication with her mother, she would sometimes allow Bethany Thompson to have seizures for several hours intermittently before bringing her to the hospital. At that  point, seizure clusters would persist until she would be given very high doses of Levetiracetam. Ativan is listed on her allergies, benzodiazepines would make her very somnolent without seizure cessation. Her mother reports that she has seizures on a daily basis. Initially mother reported that all her seizures start with staring followed by shaking, however on further questioning, she mostly has staring spells where she dazes off for 1 minute, closes her eyes, then sleeps after. These were initially occurring once a month, however in December and January, they started occurring more frequently. Last week she was having them daily, last focal seizure was yesterday. No tongue bite or incontinence. She would be tired after and would not talk for hours. She is on Depakote ER 750mg  BID, brand Vimpat 100mg  in AM, 200mg  in PM, Phenytoin 100mg  in AM, 200mg  in PM, and Levetiracetam 1500mg  BID. No side effects, she has not been on higher doses. Mother has not used the nasal spray.   Bethany Thompson is very pleasant and answer with 1-word responses. She goes to school daily. She denies any symptoms, mother agrees. She has regular periods, there is no catamenial component to her seizures. Mother also reports nocturnal seizures.    Laboratory Data 10/2021: Lacosamide level was 7 in therapeutic range. Valproic acid level was 85.6 in therapeutic range. Levetiracetam level was 35 in therapeutic range. CMP were within normal range.  CBC revealed mild low MCV, MCH and MCHC.  Hemoglobin 12.9. Vitamin D level was 25 (vitamin D insufficiency).   Diagnostic Data: MRI of the brain with and without contrast 2012 unremarkable, hippocampi symmetric.  EEG 2012: sharply contoured slow wave activity at F4, F8, Fp1 and Fp2, also generalized bursts of  diphasic spike and slow wave discharge; 40-second seizure with polyphasic 12 Hz spike activity followed by decremental actvity, eyes open on right side with full body rhythmic jerking  EEG  08/2013: solitary bifrontal sharply contoured slow wave at both central regions and generalized spike and slow wave discharge   PAST MEDICAL HISTORY: Past Medical History:  Diagnosis Date   Development delay    Moderate intellectual disabilities    Seizures (HCC)     MEDICATIONS: Current Outpatient Medications on File Prior to Visit  Medication Sig Dispense Refill   acetaminophen (TYLENOL) 160 MG/5ML solution Take 160 mg by mouth daily as needed for headache.     divalproex (DEPAKOTE ER) 250 MG 24 hr tablet Take 3 tablets (750 mg total) by mouth 2 (two) times daily. 540 tablet 3   levETIRAcetam (KEPPRA) 750 MG tablet TAKE 2 TABLETS(1500 MG) BY MOUTH TWICE DAILY 360 tablet 3   NAYZILAM 5 MG/0.1ML SOLN Apply 5 mg nasally for seizures lasting longer than 5 minutes 2 each 2   phenytoin (DILANTIN) 100 MG ER capsule Take 1 capsule (100 mg total) by mouth every morning AND 2 capsules (200 mg total) at bedtime 270 capsule 3   VIMPAT 100 MG TABS Take 1 tablet (100 mg total) by mouth every morning AND 2 tablets (200 mg total) at bedtime. 270 tablet 3   Vitamin D, Ergocalciferol, (DRISDOL) 1.25 MG (50000 UNIT) CAPS capsule Take 1 capsule (50,000 Units total) by mouth every 7 (seven) days for 8 weeks. (Patient not taking: Reported on 07/01/2023) 8 capsule 0   [DISCONTINUED] phenytoin (DILANTIN) 50 MG tablet Chew 2 tablets (100 mg total) by mouth 2 (two) times daily. 120 tablet 0   No current facility-administered medications on file prior to visit.    ALLERGIES: Allergies  Allergen Reactions   Benzodiazepines Other (See Comments)    See FYI. Per Dr. Sharene Thompson, recommended IV Keppra for seizure activity. Ativan/benzos makes patient somnolent but typically does not abort/decrease seizure activity.   Mom stated that she is not allergic     FAMILY HISTORY: Family History  Problem Relation Age of Onset   Seizures Neg Hx     SOCIAL HISTORY: Social History   Socioeconomic History   Marital  status: Single    Spouse name: Not on file   Number of children: Not on file   Years of education: Not on file   Highest education level: Not on file  Occupational History   Occupation: na  Tobacco Use   Smoking status: Never    Passive exposure: Never   Smokeless tobacco: Never  Vaping Use   Vaping status: Never Used  Substance and Sexual Activity   Alcohol use: No    Alcohol/week: 0.0 standard drinks of alcohol   Drug use: No   Sexual activity: Never    Birth control/protection: Abstinence  Other Topics Concern   Not on file  Social History Narrative   Graduated High School 2022   She had special assistance at school due to developmental delay.   She lives with mother, father, 3 brothers, 1 sister.    No tobacco exposure, no pets.    She enjoys playing video games and going to school   Right handed    Social Drivers of Health   Financial Resource Strain: Not on File (02/28/2022)   Received from [facility], [place]   [employer]    Financial Resource Strain: 0  Food Insecurity: Not at Risk (01/17/2023)   Received from [place],  [facility]   Food Insecurity    Food: 1  Transportation Needs: Not at Risk (01/17/2023)   Received from [place], [employer] Needs    Transportation: 1  Physical Activity: Not on File (02/28/2022)   Received from [place], [place]   Physical Activity    Physical Activity: 0  Stress: Not on File (02/28/2022)   Received from [facility], [place]   Stress    Stress: 0  Social Connections: Not on File (07/21/2023)   Received from [facility]   Social Connections    Connectedness: 0  Intimate Partner Violence: Not on file     PHYSICAL EXAM: Vitals:   10/31/23 1538  BP: 129/81  Pulse: 68  SpO2: 100%   General: No acute distress Head:  Normocephalic/atraumatic Skin/Extremities: No rash, no edema Neurological Exam: alert and awake. No aphasia or dysarthria. Fund of knowledge is appropriate.  Attention and concentration are normal.   Cranial nerves:  Pupils equal, round. Extraocular movements intact with no nystagmus. Visual fields full.  No facial asymmetry.  Motor: Bulk and tone normal, muscle strength 5/5 throughout with no pronator drift.   Finger to nose testing intact.  Gait narrow-based and steady, able to tandem walk adequately.  No tremors.   IMPRESSION: This is a pleasant 22 yo RH woman with a history of intellectual disability, refractory epilepsy with SCNA 1A variant, stable on current regimen, she is on 4 ASMs. Continue Depakote ER 750mg  BID, brand Vimpat 100mg  in AM, 200mg  in PM, Phenytoin 100mg  in AM, 200mg  in PM, and Levetiracetam 1500mg  BID. Typically sodium channel blockers are avoided in seizure management of SCN1A mutations, however she has been on Phenytoin and Vimpat for a while and they are satisfied with current regimen. She is reporting pain on swallowing, mother feels it is due to amount of pills, advised to spread out and try taking with yogurt. Continue seizure calendar. Follow-up in 6 months, call for any changes.   Thank you for allowing me to participate in her care.  Please do not hesitate to call for any questions or concerns.    Patrcia Dolly, M.D.   CC: Dr. Sabino Dick

## 2023-11-03 ENCOUNTER — Encounter (HOSPITAL_COMMUNITY): Payer: Self-pay

## 2023-11-03 ENCOUNTER — Emergency Department (HOSPITAL_COMMUNITY)
Admission: EM | Admit: 2023-11-03 | Discharge: 2023-11-03 | Disposition: A | Discharge status: Home / Self Care | Payer: MEDICAID | Attending: Emergency Medicine | Admitting: Emergency Medicine

## 2023-11-03 ENCOUNTER — Other Ambulatory Visit: Payer: Self-pay

## 2023-11-03 ENCOUNTER — Emergency Department (HOSPITAL_COMMUNITY): Payer: MEDICAID

## 2023-11-03 DIAGNOSIS — S0083XA Contusion of other part of head, initial encounter: Secondary | ICD-10-CM

## 2023-11-03 DIAGNOSIS — S0990XA Unspecified injury of head, initial encounter: Secondary | ICD-10-CM | POA: Diagnosis present

## 2023-11-03 DIAGNOSIS — S01111A Laceration without foreign body of right eyelid and periocular area, initial encounter: Secondary | ICD-10-CM | POA: Diagnosis not present

## 2023-11-03 DIAGNOSIS — R569 Unspecified convulsions: Secondary | ICD-10-CM | POA: Diagnosis not present

## 2023-11-03 DIAGNOSIS — W2209XA Striking against other stationary object, initial encounter: Secondary | ICD-10-CM | POA: Insufficient documentation

## 2023-11-03 LAB — COMPREHENSIVE METABOLIC PANEL
ALT: 20 U/L (ref 0–44)
AST: 20 U/L (ref 15–41)
Albumin: 4.7 g/dL (ref 3.5–5.0)
Alkaline Phosphatase: 42 U/L (ref 38–126)
Anion gap: 9 (ref 5–15)
BUN: 9 mg/dL (ref 6–20)
CO2: 23 mmol/L (ref 22–32)
Calcium: 9.4 mg/dL (ref 8.9–10.3)
Chloride: 102 mmol/L (ref 98–111)
Creatinine, Ser: 0.38 mg/dL — ABNORMAL LOW (ref 0.44–1.00)
GFR, Estimated: >60 mL/min (ref >60)
Glucose, Bld: 85 mg/dL (ref 70–99)
Potassium: 3.8 mmol/L (ref 3.5–5.1)
Sodium: 134 mmol/L — ABNORMAL LOW (ref 135–145)
Total Bilirubin: 0.6 mg/dL (ref <1.2)
Total Protein: 8.2 g/dL — ABNORMAL HIGH (ref 6.5–8.1)

## 2023-11-03 LAB — CBC WITH DIFFERENTIAL/PLATELET
Abs Immature Granulocytes: 0.02 10*3/uL (ref 0.00–0.07)
Basophils Absolute: 0 10*3/uL (ref 0.0–0.1)
Basophils Relative: 0 %
Eosinophils Absolute: 0 10*3/uL (ref 0.0–0.5)
Eosinophils Relative: 0 %
HCT: 40 % (ref 36.0–46.0)
Hemoglobin: 12.9 g/dL (ref 12.0–15.0)
Immature Granulocytes: 0 %
Lymphocytes Relative: 39 %
Lymphs Abs: 2.5 10*3/uL (ref 0.7–4.0)
MCH: 24 pg — ABNORMAL LOW (ref 26.0–34.0)
MCHC: 32.3 g/dL (ref 30.0–36.0)
MCV: 74.3 fL — ABNORMAL LOW (ref 80.0–100.0)
Monocytes Absolute: 0.6 10*3/uL (ref 0.1–1.0)
Monocytes Relative: 9 %
Neutro Abs: 3.3 10*3/uL (ref 1.7–7.7)
Neutrophils Relative %: 52 %
Platelets: 169 10*3/uL (ref 150–400)
RBC: 5.38 MIL/uL — ABNORMAL HIGH (ref 3.87–5.11)
RDW: 14.9 % (ref 11.5–15.5)
WBC: 6.3 10*3/uL (ref 4.0–10.5)
nRBC: 0 % (ref 0.0–0.2)

## 2023-11-03 LAB — CBG MONITORING, ED
Glucose-Capillary: 62 mg/dL — ABNORMAL LOW (ref 70–99)
Glucose-Capillary: 89 mg/dL (ref 70–99)

## 2023-11-03 LAB — HCG, SERUM, QUALITATIVE: Preg, Serum: NEGATIVE

## 2023-11-03 MED ORDER — HYDROCODONE-ACETAMINOPHEN 5-325 MG PO TABS
1.0000 | ORAL_TABLET | Freq: Once | ORAL | Status: AC
Start: 2023-11-03 — End: 2023-11-03
  Administered 2023-11-03: 1 via ORAL
  Filled 2023-11-03: qty 1

## 2023-11-03 MED ORDER — IBUPROFEN 800 MG PO TABS
800.0000 mg | ORAL_TABLET | Freq: Once | ORAL | Status: AC
  Administered 2023-11-03: 800 mg via ORAL
  Filled 2023-11-03: qty 1

## 2023-11-03 MED ORDER — LIDOCAINE-EPINEPHRINE-TETRACAINE (LET) TOPICAL GEL
3.0000 mL | Freq: Once | TOPICAL | Status: AC
  Administered 2023-11-03: 3 mL via TOPICAL
  Filled 2023-11-03: qty 3

## 2023-11-03 MED ORDER — LIDOCAINE-EPINEPHRINE (PF) 2 %-1:200000 IJ SOLN
10.0000 mL | Freq: Once | INTRAMUSCULAR | Status: AC
  Administered 2023-11-03: 10 mL
  Filled 2023-11-03: qty 20

## 2023-11-03 NOTE — Discharge Instructions (Addendum)
Merci de nous avoir permis de faire partie des [place] aujourd'hui.  [name] a t value aux urgences [employer] lies  [employer].  Son imagerie est ngative pour une blessure au cerveau ou des fractures.    Sa lacration a t rpare  l'aide de sutures rsorbables.  [name] n'aura pas besoin de revenir pour [employer] retirer.  Ils se dissoudront [employer] et to[place] probablement.    Glacez son visage 3 fois par jour pendant 20 minutes maximum  la fois pendant les 48 heures suivantes.  Cela aidera  rduire l'enflure.  Utilisez toujours une barrire entre sa peau et la glace pour viter de blesser la peau.    Vous pouvez lui [employer] Tylenol et/ou de l'ibuprofne si ncessaire pour soulager la douleur et [employer] visage.    Retournez aux urgences si elle dveloppe une aggravation soudaine de ses symptmes ou si vous avez de nouvelles inquitudes.  Thank you for allowing [name] to be a part of Bethany Thompson's care today.  She was evaluated in the ED for injuries related to a seizure.  Her imaging is negative for injury to her brain or broken bones.    Her laceration was repaired using absorbable sutures.  She will not need to return to have these removed.  They will dissolve over time and likely fall out.    Ice her face 3 times per day for no longer than 20 minutes at a time for the next 48 hours.  This will help with swelling.  Always use a barrier between her skin and the ice to prevent injury to the skin.    You may give her Tylenol and/or ibuprofen as needed for pain and swelling to the face.    Return to the ED if she develops sudden worsening of her symptoms or if you have new concerns.

## 2023-11-03 NOTE — ED Provider Notes (Signed)
[place] EMERGENCY DEPARTMENT AT [facility] Provider Note   CSN: [id number] Arrival date & time: 11/03/23  1605     History  Chief Complaint  Patient presents with   Seizures   Facial Laceration    Right eyebrow    Bethany Thompson is a 22 y.o. female with past medical history significant for intellectual disability, developmental delay, seizures presents to the ED with injuries to the head and face after a seizure.  Patient struck her face on a door knob and then the concrete ground when she was having a seizure.  She was postictal with EMS.  Patient is on multiple seizure medications, but does still have a few breakthrough seizures per family.         Home Medications Prior to Admission medications   Medication Sig Start Date End Date Taking? Authorizing Provider  acetaminophen (TYLENOL) 160 MG/5ML solution Take 160 mg by mouth daily as needed for headache.    [provider]  divalproex (DEPAKOTE ER) 250 MG 24 hr tablet Take 3 tablets (750 mg total) by mouth 2 (two) times daily. 10/31/23   Van Clines, MD  levETIRAcetam (KEPPRA) 750 MG tablet TAKE 2 TABLETS(1500 MG) BY MOUTH TWICE DAILY 10/31/23   Van Clines, MD  NAYZILAM 5 MG/0.1ML SOLN Apply 5 mg nasally for seizures lasting longer than 5 minutes 01/21/22   Keturah Shavers, MD  phenytoin (DILANTIN) 100 MG ER capsule Take 1 capsule (100 mg total) by mouth every morning AND 2 capsules (200 mg total) at bedtime 10/31/23   Van Clines, MD  VIMPAT 100 MG TABS Take 1 tablet (100 mg total) by mouth every morning AND 2 tablets (200 mg total) at bedtime. 10/31/23   Van Clines, MD  Vitamin D, Ergocalciferol, (DRISDOL) 1.25 MG (50000 UNIT) CAPS capsule Take 1 capsule (50,000 Units total) by mouth every 7 (seven) days for 8 weeks. Patient not taking: Reported on 10/31/2023 12/26/22   Van Clines, MD  phenytoin (DILANTIN) 50 MG tablet Chew 2 tablets (100 mg total) by mouth 2 (two) times daily.  04/09/13 05/06/13  Valla Leaver, MD      Allergies    Benzodiazepines    Review of Systems   Review of Systems  Gastrointestinal:  Negative for vomiting.  Skin:  Positive for wound.  Neurological:  Positive for seizures.  Psychiatric/Behavioral:  Positive for confusion (postictal).     Physical Exam Updated Vital Signs BP 117/64   Pulse 95   Temp 98.3 F (36.8 C) (Oral)   Resp 20   Ht 5\' 2"  (1.575 m)   Wt 49.2 kg   SpO2 100%   BMI 19.83 kg/m  Physical Exam Vitals and nursing note reviewed.  Constitutional:      General: She is not in acute distress.    Appearance: Normal appearance. She is not ill-appearing or diaphoretic.  HENT:     Head: Normocephalic. Contusion (right eyebrow and cheek) and laceration (1.5 cm right eyebrow) present. No raccoon eyes or abrasion.     Jaw: There is normal jaw occlusion.     Comments: Hematoma to right cheek, right lateral eyebrow.  Dried blood over temple.      Nose: No signs of injury.  Eyes:     General: Vision grossly intact.     Extraocular Movements: Extraocular movements intact.     Pupils: Pupils are equal, round, and reactive to light.  Cardiovascular:     Rate and Rhythm: Normal rate  and regular rhythm.  Pulmonary:     Effort: Pulmonary effort is normal.  Musculoskeletal:     Cervical back: Full passive range of motion without pain. No spinous process tenderness or muscular tenderness.  Skin:    General: Skin is warm and dry.     Capillary Refill: Capillary refill takes less than 2 seconds.  Neurological:     Mental Status: She is alert. Mental status is at baseline.  Psychiatric:        Mood and Affect: Mood normal.        Behavior: Behavior normal.     ED Results / Procedures / Treatments   Labs (all labs ordered are listed, but only abnormal results are displayed) Labs Reviewed  COMPREHENSIVE METABOLIC PANEL - Abnormal; Notable for the following components:      Result Value   Sodium 134 (*)    Creatinine,  Ser 0.38 (*)    Total Protein 8.2 (*)    All other components within normal limits  CBC WITH DIFFERENTIAL/PLATELET - Abnormal; Notable for the following components:   RBC 5.38 (*)    MCV 74.3 (*)    MCH 24.0 (*)    All other components within normal limits  CBG MONITORING, ED - Abnormal; Notable for the following components:   Glucose-Capillary 62 (*)    All other components within normal limits  HCG, SERUM, QUALITATIVE  CBG MONITORING, ED    EKG None  Radiology CT Maxillofacial Wo Contrast Result Date: 11/03/2023 CLINICAL DATA:  Blunt facial trauma. Seizures with hematoma in the right cheek and laceration of the [place] from hitting a door knob. EXAM: CT MAXILLOFACIAL WITHOUT CONTRAST TECHNIQUE: Multidetector CT imaging of the maxillofacial structures was performed. Multiplanar CT image reconstructions were also generated. RADIATION DOSE REDUCTION: This exam was performed according to the departmental dose-optimization program which includes automated exposure control, adjustment of the mA and/or kV according to patient size and/or use of iterative reconstruction technique. COMPARISON:  CT head 08/27/2021 FINDINGS: Osseous: The frontal bones, nasal bones, orbital rims, maxillary antral walls, zygomatic arches, pterygoid plates, mandibles, and temporomandibular joints appear intact. No acute displaced fractures identified. Congenital nonunion of the posterior arch of C1. Orbits: The globes and extraocular muscles appear intact and symmetrical. Sinuses: Paranasal sinuses are clear. Soft tissues: Soft tissue hematoma over the right lateral maxillary/anterior zygomatic arch region. Limited intracranial: No significant or unexpected finding. IMPRESSION: 1. No acute displaced orbital or facial fractures identified. 2. Paranasal sinuses are clear. 3. Subcutaneous soft tissue hematoma over the right facial region. Electronically Signed   By: Burman Nieves M.D.   On: 11/03/2023 19:37   CT Head Wo  Contrast Result Date: 11/03/2023 CLINICAL DATA:  Head trauma with skull fracture or hematoma. Seizures. Hematoma in the right cheek and laceration of the eyebrow from striking a door knob. EXAM: CT HEAD WITHOUT CONTRAST TECHNIQUE: Contiguous axial images were obtained from the base of the skull through the vertex without intravenous contrast. RADIATION DOSE REDUCTION: This exam was performed according to the departmental dose-optimization program which includes automated exposure control, adjustment of the mA and/or kV according to patient size and/or use of iterative reconstruction technique. COMPARISON:  MRI brain 03/27/2011.  CT head 08/27/2021 FINDINGS: Brain: No evidence of acute infarction, hemorrhage, hydrocephalus, extra-axial collection or mass lesion/mass effect. Vascular: No hyperdense vessel or unexpected calcification. Skull: Calvarium appears intact. No acute depressed skull fractures. Sinuses/Orbits: Paranasal sinuses and mastoid air cells are clear. Other: Congenital nonunion of the posterior arch of  C1. IMPRESSION: No acute intracranial abnormalities. Electronically Signed   By: Burman Nieves M.D.   On: 11/03/2023 19:32    Procedures .Laceration Repair  Date/Time: 11/03/2023 8:57 PM  Performed by: Lenard Simmer, PA-C Authorized by: Lenard Simmer, PA-C   Consent:    Consent obtained:  Verbal   Consent given by:  Parent   Risks, benefits, and alternatives were discussed: yes     Risks discussed:  Need for additional repair, nerve damage, poor wound healing, poor cosmetic result, pain and infection   Alternatives discussed:  No treatment and delayed treatment Universal protocol:    Procedure explained and questions answered to patient or proxy's satisfaction: yes     Patient identity confirmed:  Arm band Anesthesia:    Anesthesia method:  Topical application and local infiltration   Topical anesthetic:  LET   Local anesthetic:  Lidocaine 2% WITH epi Laceration details:     Location:  Face   Face location:  R eyebrow   Length (cm):  1.5 Pre-procedure details:    Preparation:  Patient was prepped and draped in usual sterile fashion and imaging obtained to evaluate for foreign bodies Exploration:    Limited defect created (wound extended): no     Hemostasis achieved with:  Direct pressure and LET   Imaging obtained: x-ray     Imaging outcome: foreign body not noted     Wound exploration: wound explored through full range of motion and entire depth of wound visualized   Treatment:    Area cleansed with:  Saline   Amount of cleaning:  Standard Skin repair:    Repair method:  Sutures   Suture size:  4-0   Suture material:  Fast-absorbing gut   Suture technique:  Simple interrupted   Number of sutures:  3 Approximation:    Approximation:  Close Repair type:    Repair type:  Simple Post-procedure details:    Dressing:  Open (no dressing)   Procedure completion:  Tolerated well, no immediate complications     Medications Ordered in ED Medications  ibuprofen (ADVIL) tablet 800 mg (800 mg Oral Given 11/03/23 1727)  HYDROcodone-acetaminophen (NORCO/VICODIN) 5-325 MG per tablet 1 tablet (1 tablet Oral Given 11/03/23 1727)  lidocaine-EPINEPHrine-tetracaine (LET) topical gel (3 mLs Topical Given 11/03/23 1853)  lidocaine-EPINEPHrine (XYLOCAINE W/EPI) 2 %-1:200000 (PF) injection 10 mL (10 mLs Infiltration Given by Other 11/03/23 2006)    ED Course/ Medical Decision Making/ A&P                                 Medical Decision Making Amount and/or Complexity of Data Reviewed Labs: ordered. Radiology: ordered.  Risk Prescription drug management.   This patient presents to the ED with chief complaint(s) of seizure, face and head injury with pertinent past medical history of seizures, developmental delay, intellectual disability.  The complaint involves an extensive differential diagnosis and also carries with it a high risk of complications and  morbidity.    The differential diagnosis includes acute intracranial injury, maxillofacial fracture   The initial plan is to obtain CT imaging, obtain labs  Additional history obtained: Additional history obtained from family, family at bedside prefer to translate for patient as she is [place] speaking. Patient is back at her baseline per family.   Initial Assessment:   Exam significant for alert patient who is not in acute distress.  She is at her baseline per family.  She makes eye contact and answers questions appropriately.  Hematoma over right lateral eyebrow and right cheek with developing ecchymosis.  Approximately 1.5 cm laceration to lateral right eyebrow.  PERRL.  No cervical spine tenderness.  She has full ROM without pain.  C-collar was removed.  No other injuries appreciated on exam.   Independent ECG/labs interpretation:  The following labs were independently interpreted:  CBC without leukocytosis or anemia.  Metabolic panel without major electrolyte disturbance.  Initial glucose 62, but improved to 89 after juice.    Independent visualization and interpretation of imaging: I independently visualized the following imaging with scope of interpretation limited to determining acute life threatening conditions related to emergency care: CT head and maxillofacial, which revealed no acute intracranial abnormality, no maxillofacial fracture.   Treatment and Reassessment: Patient given pain medicine and ibuprofen for injuries.  Laceration was repaired, see procedure section for more detail.   Disposition:   Discussed wound care with family at bedside.  Patient has absorbable sutures and will not need to return for removal.  Patient is compliant with all of her seizure medications.  I do not feel she requires medication bolus while in the ED.  She has not had further seizure activity.  Family has plan to get patient a soft, padded helmet to protect her head when seizures occur.    The  patient has been appropriately medically screened and/or stabilized in the ED. I have low suspicion for any other emergent medical condition which would require further screening, evaluation or treatment in the ED or require inpatient management. At time of discharge the patient is hemodynamically stable and in no acute distress. I have discussed work-up results and diagnosis with patient and answered all questions. Patient is agreeable with discharge plan. We discussed strict return precautions for returning to the emergency department and they verbalized understanding.             Final Clinical Impression(s) / ED Diagnoses Final diagnoses:  Seizure (HCC)  Laceration of right eyebrow, initial encounter  Contusion of face, initial encounter    Rx / DC Orders ED Discharge Orders     None         Lenard Simmer, PA-C 11/03/23 2058    Derwood Kaplan, MD 11/03/23 2246

## 2023-11-03 NOTE — ED Triage Notes (Signed)
Pt BIBA from home, c/o seizures, hematoma on right cheek and laceration of eyebrow from hitting a door knob.  Hx of seizure. Per ems pt is postictal.   BP 156/94 HR 88 O2 99 RA CBG 109

## 2023-12-16 ENCOUNTER — Telehealth: Payer: Self-pay | Admitting: Neurology

## 2023-12-16 DIAGNOSIS — G40919 Epilepsy, unspecified, intractable, without status epilepticus: Secondary | ICD-10-CM

## 2023-12-16 MED ORDER — VIMPAT 100 MG PO TABS: ORAL_TABLET | ORAL | 3 refills | Status: DC

## 2023-12-16 NOTE — Telephone Encounter (Signed)
I signed Rx. I am expecting this to be a PA issue because she is on brand, let's see pharmacy response. Thanks

## 2023-12-16 NOTE — Telephone Encounter (Signed)
Patients mother needs vimpat? It was very hard to understand her. She needs refills is what I gathered.

## 2024-02-03 ENCOUNTER — Telehealth: Payer: Self-pay | Admitting: Pharmacy Technician

## 2024-02-03 ENCOUNTER — Other Ambulatory Visit (HOSPITAL_COMMUNITY): Payer: Self-pay

## 2024-02-03 NOTE — Telephone Encounter (Signed)
 Pharmacy Patient Advocate Encounter   Received notification from Pt Calls Messages that prior authorization for VIMPAT 100MG  is required/requested.   Insurance verification completed.   The patient is insured through [employer] [place] [place] .   Per test claim: PA required and submitted KEY/EOC/Request #:   CANCELLED due to PA NOT NEEDED. Last filled on 3.7.25. Next fill on 4.1.25.

## 2024-02-03 NOTE — Telephone Encounter (Signed)
ERROR

## 2024-02-03 NOTE — Telephone Encounter (Signed)
 PA not needed. See 3.25.25 encounter

## 2024-02-17 ENCOUNTER — Other Ambulatory Visit: Payer: Self-pay | Admitting: Neurology

## 2024-02-17 DIAGNOSIS — G40919 Epilepsy, unspecified, intractable, without status epilepticus: Secondary | ICD-10-CM

## 2024-02-17 NOTE — Telephone Encounter (Signed)
 Patients mother called and LM with AN. She states the pharmacy is giving her a few doses of medication instead of 3 months worth. Her daughter is completely out of Vimpat and the pharmacy will not her what she needs.

## 2024-02-19 MED ORDER — VIMPAT 100 MG PO TABS: ORAL_TABLET | ORAL | 5 refills | Status: DC

## 2024-03-01 ENCOUNTER — Other Ambulatory Visit: Payer: MEDICAID

## 2024-03-18 ENCOUNTER — Ambulatory Visit
Admission: RE | Admit: 2024-03-18 | Discharge: 2024-03-18 | Disposition: A | Discharge status: Home / Self Care | Payer: MEDICAID | Source: Ambulatory Visit | Attending: Physician Assistant | Admitting: Physician Assistant

## 2024-03-18 DIAGNOSIS — N6320 Unspecified lump in the left breast, unspecified quadrant: Secondary | ICD-10-CM

## 2024-03-18 DIAGNOSIS — N6310 Unspecified lump in the right breast, unspecified quadrant: Secondary | ICD-10-CM

## 2024-04-26 ENCOUNTER — Other Ambulatory Visit: Payer: Self-pay | Admitting: Neurology

## 2024-04-26 DIAGNOSIS — G40919 Epilepsy, unspecified, intractable, without status epilepticus: Secondary | ICD-10-CM

## 2024-04-26 MED ORDER — VIMPAT 100 MG PO TABS
ORAL_TABLET | ORAL | 5 refills | Status: DC
Start: 2024-04-26 — End: 2024-07-06

## 2024-04-26 NOTE — Telephone Encounter (Signed)
 Pt. Mom stopped by as Rx for VIMPAT  100MG  is not at pharmacy need new script, no PA needed in March, [facility] [address]

## 2024-06-02 ENCOUNTER — Ambulatory Visit: Payer: MEDICAID | Admitting: Neurology

## 2024-06-14 ENCOUNTER — Telehealth: Payer: Self-pay | Admitting: Neurology

## 2024-06-14 NOTE — Telephone Encounter (Signed)
 Spoke with pt's mother through interpreter service. The pt has been having a seizure every day and she has had to stay at home with the pt every day.

## 2024-06-14 NOTE — Telephone Encounter (Signed)
 Pt c/o: seizure Missed medications?  Sleep deprived?   Alcohol intake?  Increased stress? Any change in medication color or shape? Back to their usual baseline self?  . If no, advise go to ER Current medications prescribed by Dr. Georjean:       [place] interpreter [phone] At 4:00pm called not in service then had to call emergency number contact [name] Minindou, to have patient contact the office, message forwarded to voicemail. Interpreter number #[phone].

## 2024-06-15 NOTE — Telephone Encounter (Signed)
 Number not in service. Sending to Dr.Aquino's box 3 attempts to contact patient. Closing chart

## 2024-06-15 NOTE — Telephone Encounter (Signed)
 Number still not in service

## 2024-06-18 NOTE — Telephone Encounter (Signed)
 Pls try again, both numbers listed for mom: [phone] and [phone]. If No to all seizure questions, I would recommend increasing brand Vimpat  to 200mg  twice a day. Thanks

## 2024-06-18 NOTE — Telephone Encounter (Signed)
 I called the Interpreter line again at [phone] and they tried to call  Pls try again, both numbers listed for mom: [phone] and [phone]. The [phone] is out of service. The [phone] no voicemail available. FYI, routing back to review this.

## 2024-06-25 NOTE — Telephone Encounter (Signed)
 Pls mail a letter that we have been trying to contact them on those numbers with no reply. Thank you!

## 2024-06-25 NOTE — Telephone Encounter (Signed)
 Mailed a letter to contact office regarding multiple calls.

## 2024-07-06 ENCOUNTER — Telehealth: Payer: Self-pay | Admitting: Neurology

## 2024-07-06 DIAGNOSIS — G40919 Epilepsy, unspecified, intractable, without status epilepticus: Secondary | ICD-10-CM

## 2024-07-06 MED ORDER — VIMPAT 100 MG PO TABS
ORAL_TABLET | ORAL | 5 refills | Status: DC
Start: 2024-07-06 — End: 2024-08-17

## 2024-07-06 NOTE — Telephone Encounter (Signed)
 Patient mother came in the office and needs to speak to someone about what is going on with the patient, with her job seizure etc

## 2024-07-06 NOTE — Telephone Encounter (Signed)
 Telephone call to patient mother, Patient has had a seizure everyday this week . Pt c/o: seizure Missed medications?  No. Sleep deprived?  No. Alcohol intake?  No. Increased stress? No. Any change in medication color or shape? No. Back to their usual baseline self?  Yes. Patient is a little tired. If no, advise go to ER Current medications prescribed by Dr. Georjean: She is on Depakote  ER 750mg  BID, brand Vimpat  100mg  in AM, 200mg  in PM, Phenytoin  100mg  in AM, 200mg  in PM, and Levetiracetam  1500mg  BID.     Patient mother needs a note for food stamps stating why patient is unable to work.

## 2024-07-13 ENCOUNTER — Encounter: Payer: Self-pay | Admitting: Neurology

## 2024-07-13 NOTE — Telephone Encounter (Signed)
 Patients mother has stopped by office and is needing a letter from Dr. Georjean about Food stamps and why patient is unable to work.  Informed mother that we have already sent this message to Dr. Georjean and we are awaiting a response.

## 2024-07-13 NOTE — Telephone Encounter (Signed)
 Done, thanks

## 2024-07-15 NOTE — Telephone Encounter (Signed)
 Called patients mother and informed her letters are ready for pick up.

## 2024-08-17 ENCOUNTER — Encounter: Payer: Self-pay | Admitting: Neurology

## 2024-08-17 ENCOUNTER — Other Ambulatory Visit: Payer: MEDICAID

## 2024-08-17 ENCOUNTER — Ambulatory Visit: Payer: MEDICAID | Admitting: Neurology

## 2024-08-17 DIAGNOSIS — G40919 Epilepsy, unspecified, intractable, without status epilepticus: Secondary | ICD-10-CM | POA: Diagnosis not present

## 2024-08-17 MED ORDER — PHENYTOIN SODIUM EXTENDED 100 MG PO CAPS: ORAL_CAPSULE | ORAL | 3 refills | Status: DC

## 2024-08-17 MED ORDER — LEVETIRACETAM 100 MG/ML PO SOLN: ORAL | 11 refills | Status: DC

## 2024-08-17 MED ORDER — DIVALPROEX SODIUM ER 250 MG PO TB24: 750.0000 mg | ORAL_TABLET | Freq: Two times a day (BID) | ORAL | 3 refills | Status: DC

## 2024-08-17 MED ORDER — VIMPAT 100 MG PO TABS: ORAL_TABLET | ORAL | 5 refills | Status: DC

## 2024-08-17 NOTE — Patient Instructions (Addendum)
 Good to see you.  Have bloodwork done for Dilantin  level, Depakote  level, Keppra  level, Vimpat  level, CBC, CMP   2. We will switch to liquid Keppra  100mg /ml: take 15mL twice a day  3. Our office will call with bloodwork results and which medication we will increase with new instructions. Continue all medications for now.  4. Follow-up in 3 months, call for any changes

## 2024-08-17 NOTE — Progress Notes (Signed)
 NEUROLOGY FOLLOW UP OFFICE NOTE  Bethany Thompson [id number] 2001-05-18  HISTORY OF PRESENT ILLNESS: I had the pleasure of seeing Bethany Thompson in follow-up in the neurology clinic on 08/17/2024.  The patient was last seen 10 months ago for intractable epilepsy. She is again accompanied by her mother Bethany Thompson who helps supplement the history today. [name] medical interpreter, Bethany Thompson, helps with the visit  Records and images were personally reviewed where available.  Since her last visit, her mother reports that she has been having seizures on a daily basis, sometimes having 2 in one day. She would tend to fall from a seizure either in the morning or around 3-4pm. Her mother describes brief convulsions lasting a few seconds. She also has nocturnal seizures. She has not used the rescue nasal spray. [name] reports that every time she takes her medication, especially the Keppra , it hurts her tongue and stomach. There is a bitter taste in her mouth. Last fall was on Saturday where she bruised herself. She is on 4 ASMs without side effects, Depakote  ER 750mg  BID, brand Vimpat  100mg  in AM, 200mg  in PM, Phenytoin  100mg  in AM, 200mg  in PM, and Levetiracetam  1500mg  BID. Her mother is scared because she has been taking medications since age 22 months and wonders what they are doing since she is still having seizures. Sleep is okay overall. I spoke to her brother Bethany Thompson on the phone, they are asking for help to reinstate her disability that was closed because she was not severe enough.     History on Initial Assessment 12/19/2022: This is a pleasant 23 year old right-handed woman with a history of intellectual disability, refractory epilepsy with SCNA 1A variant, presenting to establish adult epilepsy care. Records from pediatric neurology were reviewed. She started seeing Dr. Susen in 2012 after immigrating from [place]. Seizures started at 22 months of age after immunization. Per notes, [facility] March 2013  requested  by Dr. Emaline Thompson showed C.827A>T  Considered to be SCN 1A variant, likely disease-causing. She was initially treated with phenobarbital  and Tegretol . She was having generalized convulsions multiple times a day. Per notes, Dr. Susen had significant problems with communication with her mother, she would sometimes allow Bethany Thompson to have seizures for several hours intermittently before bringing her to the hospital. At that point, seizure clusters would persist until she would be given very high doses of Levetiracetam . Ativan  is listed on her allergies, benzodiazepines would make her very somnolent without seizure cessation. Her mother reports that she has seizures on a daily basis. Initially mother reported that all her seizures start with staring followed by shaking, however on further questioning, she mostly has staring spells where she dazes off for 1 minute, closes her eyes, then sleeps after. These were initially occurring once a month, however in December and January, they started occurring more frequently. Last week she was having them daily, last focal seizure was yesterday. No tongue bite or incontinence. She would be tired after and would not talk for hours. She is on Depakote  ER 750mg  BID, brand Vimpat  100mg  in AM, 200mg  in PM, Phenytoin  100mg  in AM, 200mg  in PM, and Levetiracetam  1500mg  BID. No side effects, she has not been on higher doses. Mother has not used the nasal spray.   [name] is very pleasant and answer with 1-word responses. She goes to school daily. She denies any symptoms, mother agrees. She has regular periods, there is no catamenial component to her seizures. Mother also reports nocturnal seizures.  Laboratory Data 10/2021: Lacosamide  level was 7 in therapeutic range. Valproic  acid level was 85.6 in therapeutic range. Levetiracetam  level was 35 in therapeutic range. CMP were within normal range.  CBC revealed mild low MCV, MCH and MCHC.  Hemoglobin 12.9. Vitamin D  level  was 25 (vitamin D  insufficiency).   Diagnostic Data: MRI of the brain with and without contrast 2012 unremarkable, hippocampi symmetric.  EEG 2012: sharply contoured slow wave activity at F4, F8, Fp1 and Fp2, also generalized bursts of diphasic spike and slow wave discharge; 40-second seizure with polyphasic 12 Hz spike activity followed by decremental actvity, eyes open on right side with full body rhythmic jerking  EEG 08/2013: solitary bifrontal sharply contoured slow wave at both central regions and generalized spike and slow wave discharge   PAST MEDICAL HISTORY: Past Medical History:  Diagnosis Date   Development delay    Moderate intellectual disabilities    Seizures (HCC)     MEDICATIONS: Current Outpatient Medications on File Prior to Visit  Medication Sig Dispense Refill   acetaminophen  (TYLENOL ) 160 MG/5ML solution Take 160 mg by mouth daily as needed for headache.     divalproex  (DEPAKOTE  ER) 250 MG 24 hr tablet Take 3 tablets (750 mg total) by mouth 2 (two) times daily. 540 tablet 3   levETIRAcetam  (KEPPRA ) 750 MG tablet TAKE 2 TABLETS(1500 MG) BY MOUTH TWICE DAILY 360 tablet 3   phenytoin  (DILANTIN ) 100 MG ER capsule Take 1 capsule (100 mg total) by mouth every morning AND 2 capsules (200 mg total) at bedtime 270 capsule 3   VIMPAT  100 MG TABS Take 2 tablets (200 mg total) twice a day 120 tablet 5   Vitamin D , Ergocalciferol , (DRISDOL ) 1.25 MG (50000 UNIT) CAPS capsule Take 1 capsule (50,000 Units total) by mouth every 7 (seven) days for 8 weeks. 8 capsule 0   NAYZILAM  5 MG/0.1ML SOLN Apply 5 mg nasally for seizures lasting longer than 5 minutes (Patient not taking: Reported on 08/17/2024) 2 each 2   [DISCONTINUED] phenytoin  (DILANTIN ) 50 MG tablet Chew 2 tablets (100 mg total) by mouth 2 (two) times daily. 120 tablet 0   No current facility-administered medications on file prior to visit.    ALLERGIES: Allergies  Allergen Reactions   Benzodiazepines Other (See  Comments)    See FYI. Per Dr. Susen, recommended IV Keppra  for seizure activity. Ativan /benzos makes patient somnolent but typically does not abort/decrease seizure activity.   Mom stated that she is not allergic     FAMILY HISTORY: Family History  Problem Relation Age of Onset   Seizures Neg Hx     SOCIAL HISTORY: Social History   Socioeconomic History   Marital status: Single    Spouse name: Not on file   Number of children: Not on file   Years of education: Not on file   Highest education level: Not on file  Occupational History   Occupation: na  Tobacco Use   Smoking status: Never    Passive exposure: Never   Smokeless tobacco: Never  Vaping Use   Vaping status: Never Used  Substance and Sexual Activity   Alcohol use: No    Alcohol/week: 0.0 standard drinks of alcohol   Drug use: No   Sexual activity: Never    Birth control/protection: Abstinence  Other Topics Concern   Not on file  Social History Narrative   Graduated High School 2022   She had special assistance at school due to developmental delay.   She lives with mother,  father, 3 brothers, 1 sister.    No tobacco exposure, no pets.    She enjoys playing video games and going to school   Right handed    No Caffiene   Social Drivers of Health   Financial Resource Strain: Not on File (02/28/2022)   Received from [employer]    Financial Resource Strain: 0  Food Insecurity: Not at Risk (01/17/2023)   Received from [employer]    Food: 1  Transportation Needs: Not at Risk (01/17/2023)   Received from [employer] Needs    Transportation: 1  Physical Activity: Not on File (02/28/2022)   Received from [facility]   Physical Activity    Physical Activity: 0  Stress: Not on File (02/28/2022)   Received from [facility]   Stress    Stress: 0  Social Connections: Not on File (07/21/2023)   Received from [employer]   Social Connections    Connectedness: 0  Intimate Partner  Violence: Not on file     PHYSICAL EXAM: Vitals:   08/17/24 1302  BP: 118/73  Pulse: 71  SpO2: 91%   General: No acute distress Head:  Normocephalic/atraumatic Skin/Extremities: No rash, no edema Neurological Exam: alert and awake. No aphasia or dysarthria. Fund of knowledge is reduced. Attention and concentration are normal.   Cranial nerves: Pupils equal, round. Extraocular movements intact with no nystagmus. Visual fields full.  No facial asymmetry.  Motor: Bulk and tone normal, muscle strength 5/5 throughout with no pronator drift.   Finger to nose testing intact.  Gait narrow-based and steady, no ataxia.   IMPRESSION: This is a pleasant 23 yo RH woman with a history of intellectual disability, refractory epilepsy with SCNA 1A variant, on 4 ASMs reporting a significant increase in seizures (1-2 daily). She is on Depakote  ER 750mg  BID, brand Vimpat  100mg  in AM, 200mg  in PM, Phenytoin  100mg  in AM, 200mg  in PM, and Levetiracetam  1500mg  BID. Her mother is hesitant to add another medication, consideration for Xcopri and weaning off one of her other medications. We also discussed VNS. We agreed to check levels for now and increase dose of one of her current medications depending on level. She is reporting difficulty swallowing Levetiracetam , switch to liquid formulation 100mg /mL: 15mL BID. Continue seizure calendar, follow-up in 3 months, call for any changes.   Thank you for allowing me to participate in her care.  Please do not hesitate to call for any questions or concerns.    Darice Shivers, M.D.   CC: Dr. Coccaro

## 2024-08-20 LAB — COMPREHENSIVE METABOLIC PANEL WITH GFR
AG Ratio: 1.7 (calc) (ref 1.0–2.5)
ALT: 14 U/L (ref 6–29)
AST: 15 U/L (ref 10–30)
Albumin: 5 g/dL (ref 3.6–5.1)
Alkaline phosphatase (APISO): 42 U/L (ref 31–125)
BUN/Creatinine Ratio: 18 (calc) (ref 6–22)
BUN: 7 mg/dL (ref 7–25)
CO2: 24 mmol/L (ref 20–32)
Calcium: 9.7 mg/dL (ref 8.6–10.2)
Chloride: 102 mmol/L (ref 98–110)
Creat: 0.39 mg/dL — ABNORMAL LOW (ref 0.50–0.96)
Globulin: 2.9 g/dL (ref 1.9–3.7)
Glucose, Bld: 73 mg/dL (ref 65–99)
Potassium: 3.7 mmol/L (ref 3.5–5.3)
Sodium: 138 mmol/L (ref 135–146)
Total Bilirubin: 0.6 mg/dL (ref 0.2–1.2)
Total Protein: 7.9 g/dL (ref 6.1–8.1)
eGFR: 144 mL/min/1.73m2 (ref >=60)

## 2024-08-20 LAB — CBC
HCT: 38.8 % (ref 35.0–45.0)
Hemoglobin: 12.1 g/dL (ref 11.7–15.5)
MCH: 23.5 pg — ABNORMAL LOW (ref 27.0–33.0)
MCHC: 31.2 g/dL — ABNORMAL LOW (ref 32.0–36.0)
MCV: 75.5 fL — ABNORMAL LOW (ref 80.0–100.0)
MPV: 12.2 fL (ref 7.5–12.5)
Platelets: 185 Thousand/uL (ref 140–400)
RBC: 5.14 Million/uL — ABNORMAL HIGH (ref 3.80–5.10)
RDW: 15.3 % — ABNORMAL HIGH (ref 11.0–15.0)
WBC: 4 Thousand/uL (ref 3.8–10.8)

## 2024-08-20 LAB — VALPROIC ACID LEVEL: Valproic Acid Lvl: 103.8 mg/L — ABNORMAL HIGH (ref 50.0–100.0)

## 2024-08-20 LAB — LACOSAMIDE, SERUM/PLASMA: Lacosamide, Serum/Plasma: 6.5 ug/mL

## 2024-08-20 LAB — PHENYTOIN LEVEL, TOTAL: Phenytoin, Total: 15.3 mg/L (ref 10.0–20.0)

## 2024-08-24 ENCOUNTER — Ambulatory Visit: Payer: Self-pay | Admitting: Neurology

## 2024-08-25 MED ORDER — LEVETIRACETAM 100 MG/ML PO SOLN: ORAL | 11 refills | Status: DC

## 2024-08-25 NOTE — Progress Notes (Signed)
 Rx sent for Keppra  100mg /mL: take 20mL BID

## 2024-09-03 ENCOUNTER — Telehealth: Payer: Self-pay | Admitting: Neurology

## 2024-09-03 NOTE — Telephone Encounter (Signed)
 Who's calling (name and relationship to patient) : Bethany Thompson; [facility]   Best contact number: [phone]  Provider they see: Dr.Aquino  Reason for call: Bethany Thompson called regarding Vimpat -medically necessary. [employer] is requiring a seizure diagnosis for payment, need ICD- 10 code, if she has seizures. Typically its G-40, but would have to get authorization.    Call ID:      PRESCRIPTION REFILL ONLY  Name of prescription: Vimpat    Pharmacy: [facility]

## 2024-09-06 ENCOUNTER — Other Ambulatory Visit (HOSPITAL_COMMUNITY): Payer: Self-pay

## 2024-09-06 ENCOUNTER — Telehealth: Payer: Self-pay | Admitting: Pharmacy Technician

## 2024-09-06 NOTE — Telephone Encounter (Signed)
 Pharmacy Patient Advocate Encounter   Received notification from Pt Calls Messages that prior authorization for VIMPAT  100MG  is required/requested.   Insurance verification completed.   The patient is insured through [facility] MEDICAID.   Per test claim: PA required; PA submitted to above mentioned insurance via Latent Key/confirmation #/EOC [id number] Status is pending

## 2024-09-06 NOTE — Telephone Encounter (Signed)
 PA has been submitted, and telephone encounter has been created. Please see telephone encounter dated 10.27.25.

## 2024-09-08 ENCOUNTER — Other Ambulatory Visit (HOSPITAL_COMMUNITY): Payer: Self-pay

## 2024-09-08 NOTE — Telephone Encounter (Signed)
 Pharmacy Patient Advocate Encounter  Received notification from [facility] MEDICAID that Prior Authorization for VIMPAT  100MG  has been APPROVED from 10.28.25 to 10.28.26. Unable to obtain price due to refill too soon rejection, last fill date 10.27.25 next available fill date11.18.25   PA #/Case ID/Reference #: [id number]

## 2024-09-21 ENCOUNTER — Encounter: Payer: Self-pay | Admitting: Neurology

## 2024-09-23 ENCOUNTER — Encounter: Payer: Self-pay | Admitting: Neurology

## 2024-09-29 ENCOUNTER — Encounter: Payer: Self-pay | Admitting: Neurology

## 2024-11-24 ENCOUNTER — Ambulatory Visit: Payer: MEDICAID | Admitting: Neurology

## 2024-11-25 ENCOUNTER — Encounter: Payer: Self-pay | Admitting: Neurology

## 2024-11-25 ENCOUNTER — Ambulatory Visit: Payer: MEDICAID | Admitting: Neurology

## 2024-11-25 DIAGNOSIS — G40919 Epilepsy, unspecified, intractable, without status epilepticus: Secondary | ICD-10-CM | POA: Diagnosis not present

## 2024-11-25 MED ORDER — PHENYTOIN SODIUM EXTENDED 100 MG PO CAPS: ORAL_CAPSULE | ORAL | 3 refills | Status: AC

## 2024-11-25 MED ORDER — VIMPAT 100 MG PO TABS: ORAL_TABLET | ORAL | 5 refills | Status: AC

## 2024-11-25 MED ORDER — DIVALPROEX SODIUM ER 250 MG PO TB24: 750.0000 mg | ORAL_TABLET | Freq: Two times a day (BID) | ORAL | 3 refills | Status: AC

## 2024-11-25 MED ORDER — NAYZILAM 5 MG/0.1ML NA SOLN: NASAL | 5 refills | Status: AC

## 2024-11-25 MED ORDER — LEVETIRACETAM 100 MG/ML PO SOLN: ORAL | 11 refills | Status: AC

## 2024-11-25 NOTE — Patient Instructions (Addendum)
 Good to see you doing better!  Continue all your medications. Refills sent for all medications, including rescue Nayzilam  spray  2. Establish care with family doctor at [facility]  3. Here are the potential programs:  [facility] program Call [phone] Email [email or web address] to get more info  [facility]  Admin-Only Location Phone: [phone] Email:[email or web address] [email or web address]  Adult Day Program Call [phone]  4. Follow-up in 4 months, call for any changes   Seizure Precautions: 1. If medication has been prescribed for you to prevent seizures, take it exactly as directed.  Do not stop taking the medicine without talking to your doctor first, even if you have not had a seizure in a long time.   2. Avoid activities in which a seizure would cause danger to yourself or to others.  Don't operate dangerous machinery, swim alone, or climb in high or dangerous places, such as on ladders, roofs, or girders.  Do not drive unless your doctor says you may.  3. If you have any warning that you may have a seizure, lay down in a safe place where you can't hurt yourself.    4.  No driving for 6 months from last seizure, as per [place]  state law.   Please refer to the following link on the Epilepsy Foundation of America's website for more information: http://www.epilepsyfoundation.org/answerplace/Social/driving/drivingu.cfm   5.  Maintain good sleep hygiene.  6.  Contact your doctor if you have any problems that may be related to the medicine you are taking.  7.  Call 911 and bring the patient back to the ED if:        A.  The seizure lasts longer than 5 minutes.       B.  The patient doesn't awaken shortly after the seizure  C.  The patient has new problems such as difficulty seeing, speaking or moving  D.  The patient was injured during the seizure  E.  The patient has a temperature over 102 F (39C)  F.  The patient  vomited and now is having trouble breathing

## 2024-11-25 NOTE — Progress Notes (Signed)
 "  NEUROLOGY FOLLOW UP OFFICE NOTE  Bethany Thompson [id number] 28-Apr-2001  Discussed the use of AI scribe software for clinical note transcription with the patient, who gave verbal consent to proceed.  History of Present Illness I had the pleasure of seeing Bethany Thompson in follow-up in the neurology clinic on 11/25/2024.  The patient was last seen 3 months ago for intractable epilepsy. She is again accompanied by her mother Bethany Thompson who helps supplement the history today. They declined an interpreter today.  Records and images were personally reviewed where available.  On her last visit, her mother reported daily seizures, sometimes 2 in one day. She was hesitant to add another ASM, we agreed to check levels, Lacosamide  level in 08/2024 was 6.5, Depakote  level 103.8, Dilantin  level 15.3. Levetiracetam  increased to 2000mg  BID (20mL BID). She continues on brand Vimpat  100mg  in AM, 200mg  in PM, Depakote  ER 750mg  BID, and Phenytoin  100mg  in AM, 200mg  in PM.   They are happy to report she is doing better. She is no longer having daily seizures, she had a seizure on Christmas day and 1-2 days ago, all nocturnal. She was sleeping Tuesday night and had one, then another yesterday morning. No tongue bite or incontinence. She is happy with the liquid Levetiracetam , the pills were difficult to swallow. She denies any complaints today, no side effects on medications.   History on Initial Assessment 12/19/2022: This is a pleasant 24 year old right-handed woman with a history of intellectual disability, refractory epilepsy with SCNA 1A variant, presenting to establish adult epilepsy care. Records from pediatric neurology were reviewed. She started seeing Dr. Susen in 2012 after immigrating from [place]. Seizures started at 54 months of age after immunization. Per notes, [facility] March 2013  requested by Dr. Emaline Fowler showed C.827A>T  Considered to be SCN 1A variant, likely disease-causing. She was initially treated  with phenobarbital  and Tegretol . She was having generalized convulsions multiple times a day. Per notes, Dr. Susen had significant problems with communication with her mother, she would sometimes allow Allani to have seizures for several hours intermittently before bringing her to the hospital. At that point, seizure clusters would persist until she would be given very high doses of Levetiracetam . Ativan  is listed on her allergies, benzodiazepines would make her very somnolent without seizure cessation. Her mother reports that she has seizures on a daily basis. Initially mother reported that all her seizures start with staring followed by shaking, however on further questioning, she mostly has staring spells where she dazes off for 1 minute, closes her eyes, then sleeps after. These were initially occurring once a month, however in December and January, they started occurring more frequently. Last week she was having them daily, last focal seizure was yesterday. No tongue bite or incontinence. She would be tired after and would not talk for hours. She is on Depakote  ER 750mg  BID, brand Vimpat  100mg  in AM, 200mg  in PM, Phenytoin  100mg  in AM, 200mg  in PM, and Levetiracetam  1500mg  BID. No side effects, she has not been on higher doses. Mother has not used the nasal spray.   Bethany Thompson is very pleasant and answer with 1-word responses. She goes to school daily. She denies any symptoms, mother agrees. She has regular periods, there is no catamenial component to her seizures. Mother also reports nocturnal seizures.    Laboratory Data 10/2021: Lacosamide  level was 7 in therapeutic range. Valproic  acid level was 85.6 in therapeutic range. Levetiracetam  level was 35 in therapeutic range. CMP were within  normal range.  CBC revealed mild low MCV, MCH and MCHC.  Hemoglobin 12.9. Vitamin D  level was 25 (vitamin D  insufficiency).   Diagnostic Data: MRI of the brain with and without contrast 2012 unremarkable,  hippocampi symmetric.  EEG 2012: sharply contoured slow wave activity at F4, F8, Fp1 and Fp2, also generalized bursts of diphasic spike and slow wave discharge; 40-second seizure with polyphasic 12 Hz spike activity followed by decremental actvity, eyes open on right side with full body rhythmic jerking  EEG 08/2013: solitary bifrontal sharply contoured slow wave at both central regions and generalized spike and slow wave discharge   PAST MEDICAL HISTORY: Past Medical History:  Diagnosis Date   Development delay    Moderate intellectual disabilities    Seizures (HCC)     MEDICATIONS: Medications Ordered Prior to Encounter[1]  ALLERGIES: Allergies[2]  FAMILY HISTORY: Family History  Problem Relation Age of Onset   Seizures Neg Hx     SOCIAL HISTORY: Social History   Socioeconomic History   Marital status: Single    Spouse name: Not on file   Number of children: Not on file   Years of education: Not on file   Highest education level: Not on file  Occupational History   Occupation: na  Tobacco Use   Smoking status: Never    Passive exposure: Never   Smokeless tobacco: Never  Vaping Use   Vaping status: Never Used  Substance and Sexual Activity   Alcohol use: No    Alcohol/week: 0.0 standard drinks of alcohol   Drug use: No   Sexual activity: Never    Birth control/protection: Abstinence  Other Topics Concern   Not on file  Social History Narrative   Graduated High School 2022   She had special assistance at school due to developmental delay.   She lives with mother, father, 3 brothers, 1 sister.    No tobacco exposure, no pets.    She enjoys playing video games and going to school   Right handed    No Caffiene   Social Drivers of Health   Tobacco Use: Low Risk (08/17/2024)   Patient History    Smoking Tobacco Use: Never    Smokeless Tobacco Use: Never    Passive Exposure: Never  Financial Resource Strain: Not on File (02/28/2022)   Received from [employer]    Financial Resource Strain: 0  Food Insecurity: Not at Risk (01/17/2023)   Received from [facility] Insecurity    Food: 1  Transportation Needs: Not at Risk (01/17/2023)   Received from [employer] Needs    Transportation: 1  Physical Activity: Not on File (02/28/2022)   Received from [facility]   Physical Activity    Physical Activity: 0  Stress: Not on File (02/28/2022)   Received from [facility]   Stress    Stress: 0  Social Connections: Not on File (07/21/2023)   Received from [employer]   Social Connections    Connectedness: 0  Intimate Partner Violence: Not on file  Depression (PHQ2-9): Not on file  Alcohol Screen: Not on file  Housing: Not on file  Utilities: Not on file  Health Literacy: Not on file     PHYSICAL EXAM: Vitals:   11/25/24 1046  BP: 116/78  Pulse: 72  SpO2: 100%   General: No acute distress Head:  Normocephalic/atraumatic Skin/Extremities: No rash, no edema Neurological Exam: alert and awake. No aphasia or dysarthria. Fund of knowledge is reduced.  Attention and concentration are normal.   Cranial nerves: Pupils equal, round. Extraocular movements intact with no nystagmus. Visual fields full.  No facial asymmetry.  Motor: Bulk and tone normal, muscle strength 5/5 throughout with no pronator drift.   Finger to nose testing intact.  Gait narrow-based and steady, able to tandem walk adequately.  Romberg negative.   IMPRESSION: This is a pleasant 24 yo RH woman with a history of intellectual disability, refractory epilepsy with SCNA 1A variant, on 4 ASMs. There has been an improvement in seizure frequency with increase in Levetiracetam  to 2000mg  BID (20mL BID). Continue Depakote  ER 750mg  BID, brand Vimpat  100mg  in AM, 200mg  in PM, Phenytoin  100mg  in AM, 200mg  in PM. Refills sent for prn Nayzilam  for seizure rescue. Her mother is happy with improvement in seizures and asks about local day programs, information provided. Continue close  supervision. Follow-up in 4 months, call for any changes.   Thank you for allowing me to participate in her care.  Please do not hesitate to call for any questions or concerns.    Darice Shivers, M.D.   CC: Folashade Paseda, FNP      [1]  Current Outpatient Medications on File Prior to Visit  Medication Sig Dispense Refill   acetaminophen  (TYLENOL ) 160 MG/5ML solution Take 160 mg by mouth daily as needed for headache.     divalproex  (DEPAKOTE  ER) 250 MG 24 hr tablet Take 3 tablets (750 mg total) by mouth 2 (two) times daily. 540 tablet 3   levETIRAcetam  (KEPPRA ) 100 MG/ML solution Take 20mL (2000mg ) twice a day 1200 mL 11   NAYZILAM  5 MG/0.1ML SOLN Apply 5 mg nasally for seizures lasting longer than 5 minutes (Patient not taking: Reported on 08/17/2024) 2 each 2   phenytoin  (DILANTIN ) 100 MG ER capsule Take 1 capsule (100 mg total) by mouth every morning AND 2 capsules (200 mg total) at bedtime 270 capsule 3   VIMPAT  100 MG TABS Take 2 tablets (200 mg total) twice a day 120 tablet 5   Vitamin D , Ergocalciferol , (DRISDOL ) 1.25 MG (50000 UNIT) CAPS capsule Take 1 capsule (50,000 Units total) by mouth every 7 (seven) days for 8 weeks. 8 capsule 0   [DISCONTINUED] phenytoin  (DILANTIN ) 50 MG tablet Chew 2 tablets (100 mg total) by mouth 2 (two) times daily. 120 tablet 0   No current facility-administered medications on file prior to visit.  [2]  Allergies Allergen Reactions   Benzodiazepines Other (See Comments)    See FYI. Per Dr. Susen, recommended IV Keppra  for seizure activity. Ativan /benzos makes patient somnolent but typically does not abort/decrease seizure activity.   Mom stated that she is not allergic    "

## 2024-11-26 ENCOUNTER — Encounter: Payer: Self-pay | Admitting: Nurse Practitioner

## 2024-11-26 ENCOUNTER — Ambulatory Visit: Payer: MEDICAID | Admitting: Nurse Practitioner

## 2024-11-26 VITALS — BP 106/53 | HR 73 | Wt 103.0 lb

## 2024-11-26 DIAGNOSIS — G40909 Epilepsy, unspecified, not intractable, without status epilepticus: Secondary | ICD-10-CM | POA: Diagnosis not present

## 2024-11-26 DIAGNOSIS — F70 Mild intellectual disabilities: Secondary | ICD-10-CM

## 2024-11-26 NOTE — Patient Instructions (Addendum)
 Please come fasting to your next appointment  Get Tdap vaccine and second dose of meningococcal vaccine at the pharmacy   It is important that you exercise regularly at least 30 minutes 5 times a week as tolerated  Think about what you will eat, plan ahead. Choose  clean, green, fresh or frozen over canned, processed or packaged foods which are more sugary, salty and fatty. 70 to 75% of food eaten should be vegetables and fruit. Three meals at set times with snacks allowed between meals, but they must be fruit or vegetables. Aim to eat over a 12 hour period , example 7 am to 7 pm, and STOP after  your last meal of the day. Drink water ,generally about 64 ounces per day, no other drink is as healthy. Fruit juice is best enjoyed in a healthy way, by EATING the fruit.  Thanks for choosing [facility] we consider it a privelige to serve you.

## 2024-11-26 NOTE — Assessment & Plan Note (Signed)
 Continue Depakote , Keppra , Dilantin , Vimpat , Continue Nayzilam  spray as directed

## 2024-11-26 NOTE — Assessment & Plan Note (Signed)
 Significant developmental delays with language and daily living skills. - Explore educational opportunities at [facility] for special education classes.

## 2024-11-26 NOTE — Progress Notes (Signed)
 "  Established Patient Office Visit  Subjective:  Patient ID: Bethany Thompson, female    DOB: 01/30/2001  Age: 24 y.o. MRN: [id number]  CC:  Chief Complaint  Patient presents with   Establish Care    HPI   Discussed the use of AI scribe software for clinical note transcription with the patient, who gave verbal consent to proceed.  History of Present Illness Bethany Thompson is a 24 year old female with a history of seizures who presents to establish care. She is accompanied by her mother..  Interpretation services provided by medical interpreter  She has a history of seizures and is currently taking Depakote , Keppra  Dilantin , and Vimpat  for management.  Her mother expresses concern about her educational and developmental progress. She completed high school in the [place]  but does not speak English and has not pursued further education or employment outside the home. Her mother reports that she has had difficulties with learning and memory since childhood, requiring assistance with daily activities and navigation.  She has not been sexually active and denies the use of alcohol, drugs, or tobacco. She primarily stays at home and helps with household tasks.    Assessment & Plan       Past Medical History:  Diagnosis Date   Development delay    Moderate intellectual disabilities    Seizures (HCC)     History reviewed. No pertinent surgical history.  Family History  Problem Relation Age of Onset   Seizures Neg Hx     Social History   Socioeconomic History   Marital status: Single    Spouse name: Not on file   Number of children: Not on file   Years of education: Not on file   Highest education level: Not on file  Occupational History   Occupation: na  Tobacco Use   Smoking status: Never    Passive exposure: Never   Smokeless tobacco: Never  Vaping Use   Vaping status: Never Used  Substance and Sexual Activity   Alcohol use: No    Alcohol/week: 0.0 standard  drinks of alcohol   Drug use: No   Sexual activity: Never    Birth control/protection: Abstinence  Other Topics Concern   Not on file  Social History Narrative   Graduated High School 2022   She had special assistance at school due to developmental delay.   She lives with mother, father, 3 brothers, 1 sister.    No tobacco exposure, no pets.    She enjoys playing video games and going to school   Right handed    No Caffiene   Social Drivers of Health   Tobacco Use: Low Risk (11/26/2024)   Patient History    Smoking Tobacco Use: Never    Smokeless Tobacco Use: Never    Passive Exposure: Never  Financial Resource Strain: Not on File (02/28/2022)   Received from [facility]    Financial Resource Strain: 0  Food Insecurity: No Food Insecurity (11/26/2024)   Epic    Worried About Programme Researcher, Broadcasting/film/video in the Last Year: Never true    Ran Out of Food in the Last Year: Never true  Transportation Needs: No Transportation Needs (11/26/2024)   Epic    Lack of Transportation (Medical): No    Lack of Transportation (Non-Medical): No  Physical Activity: Not on File (02/28/2022)   Received from [facility]   Physical Activity    Physical Activity: 0  Stress: Not on File (02/28/2022)  Received from [facility]   Stress    Stress: 0  Social Connections: Not on File (07/21/2023)   Received from [facility]   Social Connections    Connectedness: 0  Intimate Partner Violence: Not At Risk (11/26/2024)   Epic    Fear of Current or Ex-Partner: No    Emotionally Abused: No    Physically Abused: No    Sexually Abused: No  Depression (PHQ2-9): Low Risk (11/26/2024)   Depression (PHQ2-9)    PHQ-2 Score: 0  Alcohol Screen: Not on file  Housing: Low Risk (11/26/2024)   Epic    Unable to Pay for Housing in the Last Year: No    Number of Times Moved in the Last Year: 0    Homeless in the Last Year: No  Utilities: Not At Risk (11/26/2024)   Epic    Threatened with loss of utilities: No  Health  Literacy: Not on file    Outpatient Medications Prior to Visit  Medication Sig Dispense Refill   acetaminophen  (TYLENOL ) 160 MG/5ML solution Take 160 mg by mouth daily as needed for headache.     divalproex  (DEPAKOTE  ER) 250 MG 24 hr tablet Take 3 tablets (750 mg total) by mouth 2 (two) times daily. 540 tablet 3   levETIRAcetam  (KEPPRA ) 100 MG/ML solution Take 20mL (2000mg ) twice a day 1200 mL 11   phenytoin  (DILANTIN ) 100 MG ER capsule Take 1 capsule (100 mg total) by mouth every morning AND 2 capsules (200 mg total) at bedtime 270 capsule 3   VIMPAT  100 MG TABS Take 2 tablets (200 mg total) twice a day 120 tablet 5   NAYZILAM  5 MG/0.1ML SOLN Apply 5 mg nasally for seizures lasting longer than 5 minutes (Patient not taking: Reported on 11/26/2024) 5 each 5   Vitamin D , Ergocalciferol , (DRISDOL ) 1.25 MG (50000 UNIT) CAPS capsule Take 1 capsule (50,000 Units total) by mouth every 7 (seven) days for 8 weeks. (Patient not taking: Reported on 11/26/2024) 8 capsule 0   No facility-administered medications prior to visit.    Allergies[1]  ROS Review of Systems  Constitutional:  Negative for appetite change, chills, fatigue and fever.  HENT:  Negative for congestion, postnasal drip, rhinorrhea and sneezing.   Respiratory:  Negative for cough, shortness of breath and wheezing.   Cardiovascular:  Negative for chest pain, palpitations and leg swelling.  Gastrointestinal:  Negative for abdominal pain, constipation, nausea and vomiting.  Genitourinary:  Negative for difficulty urinating, dysuria, flank pain and frequency.  Musculoskeletal:  Negative for arthralgias, back pain, joint swelling and myalgias.  Skin:  Negative for color change, pallor, rash and wound.  Neurological:  Positive for seizures. Negative for dizziness, facial asymmetry, weakness, numbness and headaches.  Psychiatric/Behavioral:  Negative for confusion, self-injury and suicidal ideas.       Objective:    Physical  Exam Vitals and nursing note reviewed.  Constitutional:      General: She is not in acute distress.    Appearance: Normal appearance. She is not ill-appearing, toxic-appearing or diaphoretic.  Eyes:     General: No scleral icterus.       Right eye: No discharge.        Left eye: No discharge.     Extraocular Movements: Extraocular movements intact.     Conjunctiva/sclera: Conjunctivae normal.  Cardiovascular:     Rate and Rhythm: Normal rate and regular rhythm.     Pulses: Normal pulses.     Heart sounds: Normal heart sounds. No murmur heard.  No friction rub. No gallop.  Pulmonary:     Effort: Pulmonary effort is normal. No respiratory distress.     Breath sounds: Normal breath sounds. No stridor. No wheezing, rhonchi or rales.  Chest:     Chest wall: No tenderness.  Abdominal:     General: There is no distension.     Palpations: Abdomen is soft.     Tenderness: There is no abdominal tenderness. There is no right CVA tenderness, left CVA tenderness or guarding.  Musculoskeletal:        General: No swelling, tenderness, deformity or signs of injury.     Right lower leg: No edema.     Left lower leg: No edema.  Skin:    General: Skin is warm and dry.     Capillary Refill: Capillary refill takes less than 2 seconds.     Coloration: Skin is not jaundiced or pale.     Findings: No bruising, erythema or lesion.  Neurological:     Mental Status: She is alert and oriented to person, place, and time.     Motor: No weakness.     Coordination: Coordination normal.     Gait: Gait normal.  Psychiatric:        Mood and Affect: Mood normal.        Behavior: Behavior normal.        Thought Content: Thought content normal.        Judgment: Judgment normal.     BP (!) 106/53   Pulse 73   Wt 103 lb (46.7 kg)   SpO2 100%   BMI 20.12 kg/m  Wt Readings from Last 3 Encounters:  11/26/24 103 lb (46.7 kg)  11/25/24 100 lb 9.6 oz (45.6 kg)  08/17/24 98 lb (44.5 kg)    Lab Results   Component Value Date   TSH 1.878 06/10/2016   Lab Results  Component Value Date   WBC 4.0 08/17/2024   HGB 12.1 08/17/2024   HCT 38.8 08/17/2024   MCV 75.5 (L) 08/17/2024   PLT 185 08/17/2024   Lab Results  Component Value Date   NA 138 08/17/2024   K 3.7 08/17/2024   CO2 24 08/17/2024   GLUCOSE 73 08/17/2024   BUN 7 08/17/2024   CREATININE 0.39 (L) 08/17/2024   BILITOT 0.6 08/17/2024   ALKPHOS 42 11/03/2023   AST 15 08/17/2024   ALT 14 08/17/2024   PROT 7.9 08/17/2024   ALBUMIN 4.7 11/03/2023   CALCIUM 9.7 08/17/2024   ANIONGAP 9 11/03/2023   EGFR 144 08/17/2024   GFR 134.64 12/19/2022   No results found for: CHOL No results found for: HDL No results found for: LDLCALC No results found for: TRIG No results found for: CHOLHDL Lab Results  Component Value Date   HGBA1C 5.0 06/10/2016      Assessment & Plan:   Problem List Items Addressed This Visit       Nervous and Auditory   Seizure disorder (HCC) - Primary   Continue Depakote , Keppra , Dilantin , Vimpat , Continue Nayzilam  spray as directed         Other   Mild intellectual disability (Chronic)   Significant developmental delays with language and daily living skills. - Explore educational opportunities at [facility] for special education classes.       No orders of the defined types were placed in this encounter.   Follow-up: Return in about 2 months (around 01/24/2025) for CPE.    Sirr Kabel R Lafern Brinkley, FNP     [  1]  Allergies Allergen Reactions   Benzodiazepines Other (See Comments)    See FYI. Per Dr. Susen, recommended IV Keppra  for seizure activity. Ativan /benzos makes patient somnolent but typically does not abort/decrease seizure activity.   Mom stated that she is not allergic    "

## 2025-01-24 ENCOUNTER — Ambulatory Visit: Payer: MEDICAID | Admitting: Neurology

## 2025-03-24 ENCOUNTER — Ambulatory Visit: Payer: Self-pay | Admitting: Neurology

## 2025-04-12 ENCOUNTER — Encounter: Payer: Self-pay | Admitting: Nurse Practitioner
